# Patient Record
Sex: Male | Born: 1995 | Race: Black or African American | Hispanic: No | Marital: Single | Smoking: Current some day smoker
Health system: Southern US, Community
[De-identification: ages and names within clinical notes are randomized; demographics above are authoritative.]

## PROBLEM LIST (undated history)

## (undated) HISTORY — PX: OTHER SURGICAL HISTORY: SHX169

---
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
## 2017-02-04 ENCOUNTER — Encounter (HOSPITAL_COMMUNITY): Payer: Self-pay | Admitting: Emergency Medicine

## 2017-02-04 ENCOUNTER — Emergency Department (HOSPITAL_COMMUNITY): Payer: Medicaid Other

## 2017-02-04 DIAGNOSIS — Z91010 Allergy to peanuts: Secondary | ICD-10-CM | POA: Diagnosis not present

## 2017-02-04 DIAGNOSIS — J45909 Unspecified asthma, uncomplicated: Secondary | ICD-10-CM | POA: Diagnosis not present

## 2017-02-04 DIAGNOSIS — R079 Chest pain, unspecified: Secondary | ICD-10-CM | POA: Diagnosis present

## 2017-02-04 DIAGNOSIS — K2900 Acute gastritis without bleeding: Secondary | ICD-10-CM | POA: Insufficient documentation

## 2017-02-04 LAB — I-STAT TROPONIN, ED: Troponin i, poc: 0 ng/mL (ref 0.00–0.08)

## 2017-02-04 LAB — CBC
HEMATOCRIT: 44 % (ref 39.0–52.0)
Hemoglobin: 14.4 g/dL (ref 13.0–17.0)
MCH: 26.8 pg (ref 26.0–34.0)
MCHC: 32.7 g/dL (ref 30.0–36.0)
MCV: 81.8 fL (ref 78.0–100.0)
PLATELETS: 158 10*3/uL (ref 150–400)
RBC: 5.38 MIL/uL (ref 4.22–5.81)
RDW: 12.7 % (ref 11.5–15.5)
WBC: 7.8 10*3/uL (ref 4.0–10.5)

## 2017-02-04 LAB — BASIC METABOLIC PANEL
Anion gap: 12 (ref 5–15)
BUN: 17 mg/dL (ref 6–20)
CO2: 25 mmol/L (ref 22–32)
Calcium: 9.6 mg/dL (ref 8.9–10.3)
Chloride: 104 mmol/L (ref 101–111)
Creatinine, Ser: 1.23 mg/dL (ref 0.61–1.24)
Glucose, Bld: 104 mg/dL — ABNORMAL HIGH (ref 65–99)
Potassium: 4.2 mmol/L (ref 3.5–5.1)
SODIUM: 141 mmol/L (ref 135–145)

## 2017-02-04 NOTE — ED Triage Notes (Signed)
Pt to ED c/o intermittent central CP radiating to his L side x 3 days accompanied by intermittent SOB. Pt denies N/V/dizziness. Reports worsening pain after eating. Resp e/u, skin warm/dry.

## 2017-02-05 ENCOUNTER — Emergency Department (HOSPITAL_COMMUNITY)
Admission: EM | Admit: 2017-02-05 | Discharge: 2017-02-05 | Disposition: A | Discharge status: Home / Self Care | Payer: Medicaid Other | Attending: Emergency Medicine | Admitting: Emergency Medicine

## 2017-02-05 DIAGNOSIS — K2900 Acute gastritis without bleeding: Secondary | ICD-10-CM

## 2017-02-05 MED ORDER — SUCRALFATE 1 G PO TABS
1.0000 g | ORAL_TABLET | Freq: Three times a day (TID) | ORAL | 0 refills | Status: DC
Start: 2017-02-05 — End: 2019-07-04

## 2017-02-05 MED ORDER — OMEPRAZOLE 20 MG PO CPDR: 20.0000 mg | DELAYED_RELEASE_CAPSULE | Freq: Every day | ORAL | 0 refills | Status: DC

## 2017-02-05 MED ORDER — GI COCKTAIL ~~LOC~~
30.0000 mL | Freq: Once | ORAL | Status: AC
  Administered 2017-02-05: 30 mL via ORAL
  Filled 2017-02-05: qty 30

## 2017-02-05 NOTE — ED Notes (Signed)
Patient updated on delays and apologized for delays.

## 2017-02-05 NOTE — ED Provider Notes (Signed)
[facility]-EMERGENCY DEPT Provider Note   CSN: [id number] Arrival date & time: 02/04/17  2240    History   Chief Complaint Chief Complaint  Patient presents with  . Chest Pain    HPI Vincent Matthews is a 21 y.o. male.  21 year old male with a history of asthma presents to the emergency department for evaluation of chest pain. He states that he has been having sharp, nonradiating chest pain after eating for the past 3 days. This is intermittent and associated with mild shortness of breath. Patient took Alka-Seltzer for symptoms without significant improvement. He has not had any nausea, vomiting, dizziness, fever, leg swelling, or bowel changes. No family history of sudden cardiac death, DVT, or PE. Patient has no personal history of this. No history of abdominal surgeries. He denies any pain at present.      Past Medical History:  Diagnosis Date  . Asthma   . Dysphagia     Patient Active Problem List   Diagnosis Date Noted  . Swallowing difficulty     Past Surgical History:  Procedure Laterality Date  . ESOPHAGOGASTRODUODENOSCOPY  05/26/2012   Procedure: ESOPHAGOGASTRODUODENOSCOPY (EGD);  Surgeon: Jon Gills, MD;  Location: [facility] OR;  Service: Gastroenterology;  Laterality: N/A;  . nasal cauterization         Home Medications    Prior to Admission medications   Medication Sig Start Date End Date Taking? Authorizing Provider  acetaminophen (TYLENOL) 160 MG/5ML suspension Take 960 mg by mouth every 6 (six) hours as needed (for pain.).    Historical Provider, MD  albuterol (PROVENTIL HFA;VENTOLIN HFA) 108 (90 BASE) MCG/ACT inhaler Inhale 2 puffs into the lungs every 6 (six) hours as needed. For wheezing    Historical Provider, MD  amoxicillin (AMOXIL) 500 MG capsule Take 1 capsule (500 mg total) by mouth 3 (three) times daily. 01/26/15   Emilia Beck, PA-C  dextromethorphan (DELSYM) 30 MG/5ML liquid Take 5 mLs (30 mg total) by mouth as needed for cough. 01/26/15   Kaitlyn  Szekalski, PA-C  omeprazole (PRILOSEC) 20 MG capsule Take 1 capsule (20 mg total) by mouth daily. 02/05/17   Antony Madura, PA-C  sucralfate (CARAFATE) 1 g tablet Take 1 tablet (1 g total) by mouth 4 (four) times daily -  with meals and at bedtime. 02/05/17   Antony Madura, PA-C    Family History Family History  Problem Relation Age of Onset  . Asthma Father     Social History Social History  Substance Use Topics  . Smoking status: Never Smoker  . Smokeless tobacco: Never Used  . Alcohol use No     Allergies   Fish allergy; Peanut butter flavor; and Peanut-containing drug products   Review of Systems Review of Systems Ten systems reviewed and are negative for acute change, except as noted in the HPI.    Physical Exam Updated Vital Signs BP 110/73   Pulse 88   Temp 98.4 F (36.9 C) (Oral)   Resp 16   SpO2 100%   Physical Exam  Constitutional: He is oriented to person, place, and time. He appears well-developed and well-nourished. No distress.  Nontoxic and in NAD. Pleasant.  HENT:  Head: Normocephalic and atraumatic.  Eyes: Conjunctivae and EOM are normal. No scleral icterus.  Neck: Normal range of motion.  Cardiovascular: Normal rate, regular rhythm and intact distal pulses.   Pulmonary/Chest: Effort normal. No respiratory distress. He has no wheezes. He has no rales. He exhibits no tenderness.  Respirations even and  unlabored  Abdominal: Soft. He exhibits no distension and no mass. There is no tenderness. There is no guarding.  Soft, nontender abdomen.  Musculoskeletal: Normal range of motion.  Neurological: He is alert and oriented to person, place, and time. He exhibits normal muscle tone. Coordination normal.  Skin: Skin is warm and dry. No rash noted. He is not diaphoretic. No erythema. No pallor.  Psychiatric: He has a normal mood and affect. His behavior is normal.  Nursing note and vitals reviewed.    ED Treatments / Results  Labs (all labs ordered are  listed, but only abnormal results are displayed) Labs Reviewed  BASIC METABOLIC PANEL - Abnormal; Notable for the following:       Result Value   Glucose, Bld 104 (*)    All other components within normal limits  CBC  I-STAT TROPOININ, ED    EKG  EKG Interpretation  Date/Time:  Friday February 04 2017 23:09:26 EST Ventricular Rate:  89 PR Interval:  144 QRS Duration: 86 QT Interval:  322 QTC Calculation: 391 R Axis:   87 Text Interpretation:  Normal sinus rhythm Nonspecific T wave abnormality Abnormal ECG No old tracing to compare Confirmed by Griffiss Ec LLCGLICK  MD, DAVID ([id number]) on 02/05/2017 2:44:44 AM       Radiology Dg Chest 2 View  Result Date: 02/04/2017 CLINICAL DATA:  Chest pain EXAM: CHEST  2 VIEW COMPARISON:  Chest radiograph 01/25/2015 FINDINGS: The heart size and mediastinal contours are within normal limits. Both lungs are clear. The visualized skeletal structures are unremarkable. IMPRESSION: No active cardiopulmonary disease. Electronically Signed   By: Deatra RobinsonKevin  Herman M.D.   On: 02/04/2017 23:49    Procedures Procedures (including critical care time)  Medications Ordered in ED Medications  gi cocktail (Maalox,Lidocaine,Donnatal) (30 mLs Oral Given 02/05/17 0236)     Initial Impression / Assessment and Plan / ED Course  I have reviewed the triage vital signs and the nursing notes.  Pertinent labs & imaging results that were available during my care of the patient were reviewed by me and considered in my medical decision making (see chart for details).     21 year old male presents to the emergency department for evaluation of chest pain. Pain is sharp and has been present over the past 3 days, brought on by eating and drinking. Patient took Alka-Seltzer at home without relief. He has a heart score of 0. No family history of sudden cardiac death. Workup is reassuring today. No tachycardia, tachypnea, dyspnea, or hypoxia. Patient is PERC negative. Clinically, his symptoms are  most consistent with gastritis. Will start the patient on Prilosec and Carafate. Patient referred to his primary care doctor for follow-up. Return precautions discussed and provided. Patient discharged in stable condition with no unaddressed concerns.   Final Clinical Impressions(s) / ED Diagnoses   Final diagnoses:  Acute gastritis without hemorrhage, unspecified gastritis type    New Prescriptions New Prescriptions   OMEPRAZOLE (PRILOSEC) 20 MG CAPSULE    Take 1 capsule (20 mg total) by mouth daily.   SUCRALFATE (CARAFATE) 1 G TABLET    Take 1 tablet (1 g total) by mouth 4 (four) times daily -  with meals and at bedtime.     Antony MaduraKelly Aayla Marrocco, PA-C 02/05/17 0246    Dione Boozeavid Glick, MD 02/05/17 [phone]

## 2017-08-11 ENCOUNTER — Encounter (HOSPITAL_COMMUNITY)
Admission: EM | Disposition: A | Discharge status: Home / Self Care | Payer: Self-pay | Source: Home / Self Care | Attending: Emergency Medicine

## 2017-08-11 ENCOUNTER — Encounter (HOSPITAL_COMMUNITY): Payer: Self-pay | Admitting: Emergency Medicine

## 2017-08-11 ENCOUNTER — Ambulatory Visit (HOSPITAL_COMMUNITY)
Admission: EM | Admit: 2017-08-11 | Discharge: 2017-08-11 | Disposition: A | Discharge status: Home / Self Care | Payer: No Typology Code available for payment source | Attending: Emergency Medicine | Admitting: Emergency Medicine

## 2017-08-11 DIAGNOSIS — T18128A Food in esophagus causing other injury, initial encounter: Secondary | ICD-10-CM | POA: Insufficient documentation

## 2017-08-11 DIAGNOSIS — X58XXXA Exposure to other specified factors, initial encounter: Secondary | ICD-10-CM | POA: Diagnosis not present

## 2017-08-11 DIAGNOSIS — K200 Eosinophilic esophagitis: Secondary | ICD-10-CM | POA: Diagnosis not present

## 2017-08-11 DIAGNOSIS — J45909 Unspecified asthma, uncomplicated: Secondary | ICD-10-CM | POA: Insufficient documentation

## 2017-08-11 HISTORY — PX: ESOPHAGOGASTRODUODENOSCOPY: SHX5428

## 2017-08-11 LAB — BASIC METABOLIC PANEL
Anion gap: 8 (ref 5–15)
BUN: 13 mg/dL (ref 6–20)
CALCIUM: 9.2 mg/dL (ref 8.9–10.3)
CO2: 24 mmol/L (ref 22–32)
CREATININE: 1.04 mg/dL (ref 0.61–1.24)
Chloride: 107 mmol/L (ref 101–111)
GFR calc Af Amer: >60 mL/min (ref >60)
GFR calc non Af Amer: >60 mL/min (ref >60)
GLUCOSE: 123 mg/dL — AB (ref 65–99)
Potassium: 3.8 mmol/L (ref 3.5–5.1)
Sodium: 139 mmol/L (ref 135–145)

## 2017-08-11 LAB — CBC
HEMATOCRIT: 45.7 % (ref 39.0–52.0)
HEMOGLOBIN: 15.2 g/dL (ref 13.0–17.0)
MCH: 27.4 pg (ref 26.0–34.0)
MCHC: 33.3 g/dL (ref 30.0–36.0)
MCV: 82.5 fL (ref 78.0–100.0)
Platelets: 148 10*3/uL — ABNORMAL LOW (ref 150–400)
RBC: 5.54 MIL/uL (ref 4.22–5.81)
RDW: 12.5 % (ref 11.5–15.5)
WBC: 4.7 10*3/uL (ref 4.0–10.5)

## 2017-08-11 SURGERY — EGD (ESOPHAGOGASTRODUODENOSCOPY)
Anesthesia: Moderate Sedation

## 2017-08-11 MED ORDER — FENTANYL CITRATE (PF) 100 MCG/2ML IJ SOLN
INTRAMUSCULAR | Status: AC
  Filled 2017-08-11: qty 2

## 2017-08-11 MED ORDER — BUTAMBEN-TETRACAINE-BENZOCAINE 2-2-14 % EX AERO
INHALATION_SPRAY | CUTANEOUS | Status: DC | PRN
  Administered 2017-08-11: 2 via TOPICAL

## 2017-08-11 MED ORDER — GLUCAGON HCL RDNA (DIAGNOSTIC) 1 MG IJ SOLR
1.0000 mg | Freq: Once | INTRAMUSCULAR | Status: AC
  Administered 2017-08-11: 1 mg via INTRAVENOUS
  Filled 2017-08-11: qty 1

## 2017-08-11 MED ORDER — FENTANYL CITRATE (PF) 100 MCG/2ML IJ SOLN
INTRAMUSCULAR | Status: DC | PRN
  Administered 2017-08-11: 25 ug via INTRAVENOUS
  Administered 2017-08-11: 50 ug via INTRAVENOUS
  Administered 2017-08-11: 25 ug via INTRAVENOUS

## 2017-08-11 MED ORDER — MIDAZOLAM HCL 10 MG/2ML IJ SOLN
INTRAMUSCULAR | Status: DC | PRN
Start: 2017-08-11 — End: 2017-08-11
  Administered 2017-08-11 (×2): 2 mg via INTRAVENOUS
  Administered 2017-08-11: 1 mg via INTRAVENOUS
  Administered 2017-08-11: 2 mg via INTRAVENOUS

## 2017-08-11 MED ORDER — MIDAZOLAM HCL 5 MG/ML IJ SOLN
INTRAMUSCULAR | Status: AC
  Filled 2017-08-11: qty 2

## 2017-08-11 NOTE — ED Notes (Signed)
Attempted IV Glucagon with no success, pt continues to spit up drink. EDP is aware.

## 2017-08-11 NOTE — Interval H&P Note (Signed)
History and Physical Interval Note: Food bolus impaction, history of eosinophilic esophagitis  08/11/2017 8:25 AM  Krishay A Katrinka BlazingSmith  has presented today for EGD, with the diagnosis of food impaction  The various methods of treatment have been discussed with the patient and family. After consideration of risks, benefits and other options for treatment, the patient has consented to  Procedure(s): ESOPHAGOGASTRODUODENOSCOPY (EGD) (N/A) as a surgical intervention .  The patient's history has been reviewed, patient examined, no change in status, stable for surgery.  I have reviewed the patient's chart and labs.  Questions were answered to the patient's satisfaction.     Kerin SalenArya Amran Malter

## 2017-08-11 NOTE — Discharge Instructions (Signed)
Patient to stay on mechanical soft diet. Recommend take small bites and chew food properly. Benefit from use of PPI on a regular basis. Follow-up in the office as an outpatient. May benefit from use of swallowed steroids. May need EGD for dilatation in the future.   YOU HAD AN ENDOSCOPIC PROCEDURE TODAY: Refer to the procedure report and other information in the discharge instructions given to you for any specific questions about what was found during the examination. If this information does not answer your questions, please call [facility] office at [phone] to clarify.   YOU SHOULD EXPECT: Some feelings of bloating in the abdomen. Passage of more gas than usual. Walking can help get rid of the air that was put into your GI tract during the procedure and reduce the bloating. If you had a lower endoscopy (such as a colonoscopy or flexible sigmoidoscopy) you may notice spotting of blood in your stool or on the toilet paper. Some abdominal soreness may be present for a day or two, also.  DIET: Your first meal following the procedure should be a light meal and then it is ok to progress to your normal diet. A half-sandwich or bowl of soup is an example of a good first meal. Heavy or fried foods are harder to digest and may make you feel nauseous or bloated. Drink plenty of fluids but you should avoid alcoholic beverages for 24 hours. If you had a esophageal dilation, please see attached instructions for diet.   ACTIVITY: Your care partner should take you home directly after the procedure. You should plan to take it easy, moving slowly for the rest of the day. You can resume normal activity the day after the procedure however YOU SHOULD NOT DRIVE, use power tools, machinery or perform tasks that involve climbing or major physical exertion for 24 hours (because of the sedation medicines used during the test).   SYMPTOMS TO REPORT IMMEDIATELY: A gastroenterologist can be reached at any hour. Please call  [phone]  for any of the following symptoms:  Following lower endoscopy (colonoscopy, flexible sigmoidoscopy) Excessive amounts of blood in the stool  Significant tenderness, worsening of abdominal pains  Swelling of the abdomen that is new, acute  Fever of 100 or higher  Following upper endoscopy (EGD, EUS, ERCP, esophageal dilation) Vomiting of blood or coffee ground material  New, significant abdominal pain  New, significant chest pain or pain under the shoulder blades  Painful or persistently difficult swallowing  New shortness of breath  Black, tarry-looking or red, bloody stools  FOLLOW UP:  If any biopsies were taken you will be contacted by phone or by letter within the next 1-3 weeks. Call [phone]  if you have not heard about the biopsies in 3 weeks.  Please also call with any specific questions about appointments or follow up tests.

## 2017-08-11 NOTE — Op Note (Signed)
EGD revealed a food bolus impacted at 35 cm from insertion, it was removed in 1 piece with a [employer] basket.  The esophagus appeared to have a narrow caliber, longitudinal furrows and subtle ringed appearance compatible with known eosinophilic esophagitis diagnosed on esophageal biopsies in 2013.  Multiple superficial, linear esophageal tears were noted post-for food bolus retrieval. GE junction was located at 36 cm. The gastric cavity and duodenum appeared unremarkable.  Patient tolerated the procedure well under IV sedation.  Committed use of PPI regularly. Recommend follow up in the office as an outpatient. Will benefit from use of by swallowed steroids.   Vincent Matthews, M.D. [phone]

## 2017-08-11 NOTE — H&P (Signed)
Vincent Matthews, Vincent Matthews  21 year old African-American male presented to the ER after having difficulty swallowing. He was eating chicken at 2 AM this morning and immediately felt it stuck in the lower part of his throat. He tried washing it down with ginger ale, milk and water without any success.Whatever fluid he has been trying to drink, it seems to regurgitate back.  Patient had an EGD in 2013, and biopsies from esophagus at compatible with eosinophilic esophagitis. Has not been treated since and has not been on any medications for it.  Denies acid reflux or heartburn. Other past 5 years he has experienced  difficulty in swallowing especially with solids more than liquid.  This is not associated with weight loss. Patient denies black stools or bloody bowel movement.  Past medical history: Asthma Eosinophilic esophagitis  Past surgical history: None  Social history: Denies smoking, use of alcohol or use of recreational drugs  Family History: History of GI malignancy in the family  Review of systems: All other 12 systems were reviewed and there are no pertinent positives and negatives except for what is mentioned in history of present illness  Physical exam: Not in acute distress Overweight, no obvious pallor Chest clear  to auscultation bilaterally Heart: first and second has not some audible Abdomen: Soft, nondistended, nontender, normoactive bowel sounds Neuro: Focal neurological deficit able to move all 4 extremities Extremities: No deformity, no clubbing Skin: No rashes  Laboratory work: Unremarkable BMP unremarkable CBC  Assessment: History of eosinophilic esophagitis Food bolus impaction   Plan: EGD for food bolus disimpaction Possible esophageal biopsies Balloon dilatation if needed   Vincent SalenArya Janise Matthews, M.D. [phone]

## 2017-08-11 NOTE — Brief Op Note (Signed)
08/11/2017  8:44 AM  PATIENT:  Salvadore OxfordBrayhn A Shankland  21 y.o. male  PRE-OPERATIVE DIAGNOSIS:  food impaction  POST-OPERATIVE DIAGNOSIS:  food impaction, esophageal stricture  PROCEDURE:  Procedure(s): ESOPHAGOGASTRODUODENOSCOPY (EGD) (N/A)  SURGEON:  Surgeon(s) and Role:    Kerin Salen* Dniya Neuhaus, MD - Primary  PHYSICIAN ASSISTANT:   ASSISTANTS: none   ANESTHESIA:   IV sedation  EBL:  No intake/output data recorded.  BLOOD ADMINISTERED:none  DRAINS: none   LOCAL MEDICATIONS USED:  NONE  SPECIMEN:  No Specimen  DISPOSITION OF SPECIMEN:  N/A  COUNTS:  YES  TOURNIQUET:  * No tourniquets in log *  DICTATION: .Dragon Dictation  PLAN OF CARE: Discharge to home after PACU  PATIENT DISPOSITION:  PACU - hemodynamically stable.   Delay start of Pharmacological VTE agent (>24hrs) due to surgical blood loss or risk of bleeding: no

## 2017-08-11 NOTE — ED Triage Notes (Signed)
Pt reports eating chicken approx 30 min ago and felt it get stuck in his throat. Pt attempted to drink soda with no relief, kept spitting up the drink. Pt has muffled voice, no SOB. Hx of same. EDP aware.

## 2017-08-11 NOTE — ED Notes (Signed)
Per Dr. Blinda LeatherwoodPollina no orders at this time, work on getting pt room

## 2017-08-11 NOTE — ED Notes (Signed)
Patient is able to speak in complete sentences, no resp difficulty. States this has happened before however its usually pork chops.

## 2017-08-11 NOTE — ED Provider Notes (Signed)
[facility]-EMERGENCY DEPT Provider Note   CSN: [id number] Arrival date & time: 08/11/17  0243     History   Chief Complaint Chief Complaint  Patient presents with  . Food Stuck in Throat    HPI Vincent Matthews is a 21 y.o. male.  The history is provided by the patient. No language interpreter was used.  Swallowed Foreign Body  This is a new problem. The current episode started 6 to 12 hours ago. The problem occurs constantly. The problem has not changed since onset.Pertinent negatives include no chest pain and no abdominal pain. Nothing aggravates the symptoms. Nothing relieves the symptoms. He has tried nothing for the symptoms. The treatment provided no relief.  Pt ate chicken last night  Pt reports he has a esophageal deformity.  Pt has had a previous episode.  Past Medical History:  Diagnosis Date  . Asthma   . Dysphagia     Patient Active Problem List   Diagnosis Date Noted  . Swallowing difficulty     Past Surgical History:  Procedure Laterality Date  . ESOPHAGOGASTRODUODENOSCOPY  05/26/2012   Procedure: ESOPHAGOGASTRODUODENOSCOPY (EGD);  Surgeon: Jon Gills, MD;  Location: [facility] OR;  Service: Gastroenterology;  Laterality: N/A;  . nasal cauterization         Home Medications    Prior to Admission medications   Medication Sig Start Date End Date Taking? Authorizing Provider  acetaminophen (TYLENOL) 160 MG/5ML suspension Take 960 mg by mouth every 6 (six) hours as needed (for pain.).    [provider]  albuterol (PROVENTIL HFA;VENTOLIN HFA) 108 (90 BASE) MCG/ACT inhaler Inhale 2 puffs into the lungs every 6 (six) hours as needed. For wheezing    [provider]  amoxicillin (AMOXIL) 500 MG capsule Take 1 capsule (500 mg total) by mouth 3 (three) times daily. 01/26/15   Emilia Beck, PA-C  dextromethorphan (DELSYM) 30 MG/5ML liquid Take 5 mLs (30 mg total) by mouth as needed for cough. 01/26/15   Emilia Beck, PA-C  omeprazole (PRILOSEC) 20  MG capsule Take 1 capsule (20 mg total) by mouth daily. 02/05/17   Antony Madura, PA-C  sucralfate (CARAFATE) 1 g tablet Take 1 tablet (1 g total) by mouth 4 (four) times daily -  with meals and at bedtime. 02/05/17   Antony Madura, PA-C    Family History Family History  Problem Relation Age of Onset  . Asthma Father     Social History Social History  Substance Use Topics  . Smoking status: Never Smoker  . Smokeless tobacco: Never Used  . Alcohol use No     Allergies   Fish allergy; Peanut butter flavor; and Peanut-containing drug products   Review of Systems Review of Systems  Cardiovascular: Negative for chest pain.  Gastrointestinal: Negative for abdominal pain.  All other systems reviewed and are negative.    Physical Exam Updated Vital Signs BP 112/73   Pulse (!) 101   Temp 98.1 F (36.7 C) (Oral)   Resp 18   Ht  (1.753 m)   Wt 117.9 kg (260 lb)   SpO2 97%   BMI 38.40 kg/m   Physical Exam  Constitutional: He appears well-developed and well-nourished.  HENT:  Head: Normocephalic.  Right Ear: External ear normal.  Left Ear: External ear normal.  Mouth/Throat: Oropharynx is clear and moist.  Eyes: Pupils are equal, round, and reactive to light. Conjunctivae and EOM are normal.  Neck: Normal range of motion.  Cardiovascular: Normal rate.   Pulmonary/Chest:  Effort normal.  Musculoskeletal: Normal range of motion.  Neurological: He is alert.  Skin: Skin is warm.  Psychiatric: He has a normal mood and affect.  Nursing note and vitals reviewed.  Pt unable to tolerate po fluids,  Pt spitting up saliva and unable to sip water.  Pt failed glucogan  ED Treatments / Results  Labs (all labs ordered are listed, but only abnormal results are displayed) Labs Reviewed  CBC - Abnormal; Notable for the following:       Result Value   Platelets 148 (*)    All other components within normal limits  BASIC METABOLIC PANEL - Abnormal; Notable for the following:     Glucose, Bld 123 (*)    All other components within normal limits    EKG  EKG Interpretation None       Radiology No results found.  Procedures Procedures (including critical care time)  Medications Ordered in ED Medications  glucagon (human recombinant) (GLUCAGEN) injection 1 mg (1 mg Intravenous Given 08/11/17 0329)     Initial Impression / Assessment and Plan / ED Course  I have reviewed the triage vital signs and the nursing notes.  Pertinent labs & imaging results that were available during my care of the patient were reviewed by me and considered in my medical decision making (see chart for details).     I spoke to Dr. Ewing SchleinMagod who will see pt   Final Clinical Impressions(s) / ED Diagnoses   Final diagnoses:  Food impaction of esophagus, initial encounter    New Prescriptions New Prescriptions   No medications on file     Osie CheeksSofia, Sharayah Renfrow K, PA-C 08/11/17 0730    Elson AreasSofia, Clinton Dragone K, PA-C 08/11/17 1316    Gilda CreasePollina, Christopher J, MD 08/14/17 [phone]

## 2017-08-11 NOTE — Op Note (Signed)
[facility] Patient Name: Vincent Matthews Procedure Date : 08/11/2017 MRN: [id number] Attending MD: Kerin Salen , MD Date of Birth: September 06, 1996 CSN: [id number] Age: 21 Admit Type: Emergency Department Procedure:                Upper GI endoscopy Indications:              Foreign body/food bolus in the esophagus Providers:                Kerin Salen, MD, Norman Clay, RN, Zoila Shutter,                            Technician Referring MD:              Medicines:                Midazolam 7 mg IV, Fentanyl 100 micrograms IV Complications:            No immediate complications. Estimated Blood Loss:     Estimated blood loss: none. Procedure:                Pre-Anesthesia Assessment:                           - Prior to the procedure, a History and Physical                            was performed, and patient medications and                            allergies were reviewed. The patient's tolerance of                            previous anesthesia was also reviewed. The risks                            and benefits of the procedure and the sedation                            options and risks were discussed with the patient.                            All questions were answered, and informed consent                            was obtained. Prior Anticoagulants: The patient has                            taken no previous anticoagulant or antiplatelet                            agents. ASA Grade Assessment: II - A patient with                            mild systemic disease. After reviewing the risks  and benefits, the patient was deemed in                            satisfactory condition to undergo the procedure.                           After obtaining informed consent, the endoscope was                            passed under direct vision. Throughout the                            procedure, the patient's blood pressure, pulse, and        oxygen saturations were monitored continuously. The                            [id number]-[id number] ([id number]) scope was introduced through the                            mouth, and advanced to the second part of duodenum.                            The upper GI endoscopy was accomplished without                            difficulty. The patient tolerated the procedure                            well. Scope In: Scope Out: Findings:      Food was found in the lower third of the esophagus. Removal of food was       accomplished. A basket was used to retrieved the food bolus in one       piece, it was at 35 cm from insertion, above the GE junction located at       36 cm.      Mucosal changes including ringed esophagus, longitudinal furrows,       small-caliber esophagus, circumferential folds, longitudinal markings,       mucosal friability and tight circumferential folds were found in the       upper third of the esophagus, in the middle third of the esophagus and       in the lower third of the esophagus. Esophageal findings were graded       using the Eosinophilic Esophagitis Endoscopic Reference Score       (EoE-EREFS) as: Edema Grade 1 Present (decreased clarity or absence of       vascular markings), Rings Grade 1 Mild (subtle circumferential ridges       seen on esophageal distension), Exudates Grade 0 None (no white lesions       seen), Furrows Grade 1 Present (vertical lines with or without visible       depth) and Stricture present.      Several superficial small, linear tears were appreciated after the food       bolus was removed in upper,mid and distal esophagus.      The entire examined stomach was normal.      The cardia  and gastric fundus were normal on retroflexion.      The examined duodenum was normal. Impression:               - Food in the lower third of the esophagus. Removal                            was successful.                           - Esophageal mucosal changes  secondary to                            eosinophilic esophagitis.                           - Normal stomach.                           - Normal examined duodenum. Moderate Sedation:      Moderate (conscious) sedation was administered by the endoscopy nurse       and supervised by the endoscopist. The following parameters were       monitored: oxygen saturation, heart rate, blood pressure, respiratory       rate, EKG, adequacy of pulmonary ventilation, and response to care. Recommendation:           - Mechanical soft diet.                           - Use Protonix (pantoprazole) 40 mg PO daily.                           - Patient will benefit from swallowed steroid use                            and repeat EGD for dilatation if needed.                           - Return to GI office at appointment to be                            scheduled.                           - Post procedure medication instructions were                            provided to the patient.                           - Patient has a contact number available for                            emergencies. The signs and symptoms of potential                            delayed complications were discussed with the  patient. Return to normal activities tomorrow.                            Written discharge instructions were provided to the                            patient. Procedure Code(s):        --- Professional ---                           [phone], Esophagogastroduodenoscopy, flexible,                            transoral; with removal of foreign body(s) Diagnosis Code(s):        --- Professional ---                           U04.540J, Food in esophagus causing other injury,                            initial encounter                           K20.0, Eosinophilic esophagitis                           T18.108A, Unspecified foreign body in esophagus                            causing other  injury, initial encounter CPT copyright 2016 [employer]. All rights reserved. The codes documented in this report are preliminary and upon coder review may  be revised to meet current compliance requirements. Kerin Salen, MD 08/11/2017 8:44:22 AM This report has been signed electronically. Number of Addenda: 0

## 2018-02-16 ENCOUNTER — Other Ambulatory Visit: Payer: Self-pay

## 2018-02-16 ENCOUNTER — Emergency Department (HOSPITAL_COMMUNITY)
Admission: EM | Admit: 2018-02-16 | Discharge: 2018-02-16 | Discharge status: Against Medical Advice | Payer: No Typology Code available for payment source

## 2018-02-16 NOTE — ED Triage Notes (Signed)
Pt states he is leaivng

## 2018-08-29 ENCOUNTER — Emergency Department (HOSPITAL_COMMUNITY): Payer: 59

## 2018-08-29 ENCOUNTER — Other Ambulatory Visit: Payer: Self-pay

## 2018-08-29 ENCOUNTER — Emergency Department (HOSPITAL_COMMUNITY)
Admission: EM | Admit: 2018-08-29 | Discharge: 2018-08-29 | Disposition: A | Discharge status: Home / Self Care | Payer: 59 | Attending: Emergency Medicine | Admitting: Emergency Medicine

## 2018-08-29 DIAGNOSIS — R0789 Other chest pain: Secondary | ICD-10-CM | POA: Diagnosis not present

## 2018-08-29 DIAGNOSIS — R0602 Shortness of breath: Secondary | ICD-10-CM | POA: Diagnosis not present

## 2018-08-29 DIAGNOSIS — J45909 Unspecified asthma, uncomplicated: Secondary | ICD-10-CM | POA: Diagnosis not present

## 2018-08-29 DIAGNOSIS — R079 Chest pain, unspecified: Secondary | ICD-10-CM | POA: Diagnosis present

## 2018-08-29 LAB — CBC
HEMATOCRIT: 47.9 % (ref 39.0–52.0)
HEMOGLOBIN: 15.7 g/dL (ref 13.0–17.0)
MCH: 27.7 pg (ref 26.0–34.0)
MCHC: 32.8 g/dL (ref 30.0–36.0)
MCV: 84.6 fL (ref 78.0–100.0)
Platelets: 164 10*3/uL (ref 150–400)
RBC: 5.66 MIL/uL (ref 4.22–5.81)
RDW: 12 % (ref 11.5–15.5)
WBC: 4.5 10*3/uL (ref 4.0–10.5)

## 2018-08-29 LAB — I-STAT TROPONIN, ED
Troponin i, poc: 0 ng/mL (ref 0.00–0.08)
Troponin i, poc: 0 ng/mL (ref 0.00–0.08)

## 2018-08-29 LAB — BASIC METABOLIC PANEL
ANION GAP: 10 (ref 5–15)
BUN: 13 mg/dL (ref 6–20)
CHLORIDE: 106 mmol/L (ref 98–111)
CO2: 25 mmol/L (ref 22–32)
Calcium: 9.2 mg/dL (ref 8.9–10.3)
Creatinine, Ser: 1.22 mg/dL (ref 0.61–1.24)
GFR calc non Af Amer: >60 mL/min (ref >60)
Glucose, Bld: 118 mg/dL — ABNORMAL HIGH (ref 70–99)
POTASSIUM: 3.9 mmol/L (ref 3.5–5.1)
Sodium: 141 mmol/L (ref 135–145)

## 2018-08-29 MED ORDER — ALBUTEROL SULFATE HFA 108 (90 BASE) MCG/ACT IN AERS
1.0000 | INHALATION_SPRAY | Freq: Once | RESPIRATORY_TRACT | Status: AC
  Administered 2018-08-29: 1 via RESPIRATORY_TRACT
  Filled 2018-08-29: qty 6.7

## 2018-08-29 MED ORDER — PREDNISONE 20 MG PO TABS
60.0000 mg | ORAL_TABLET | Freq: Once | ORAL | Status: AC
  Administered 2018-08-29: 60 mg via ORAL
  Filled 2018-08-29: qty 3

## 2018-08-29 MED ORDER — PREDNISONE 10 MG PO TABS: 40.0000 mg | ORAL_TABLET | Freq: Every day | ORAL | 0 refills | Status: AC

## 2018-08-29 MED ORDER — IPRATROPIUM-ALBUTEROL 0.5-2.5 (3) MG/3ML IN SOLN
3.0000 mL | Freq: Once | RESPIRATORY_TRACT | Status: AC
  Administered 2018-08-29: 3 mL via RESPIRATORY_TRACT
  Filled 2018-08-29: qty 3

## 2018-08-29 MED ORDER — ALBUTEROL SULFATE (2.5 MG/3ML) 0.083% IN NEBU
5.0000 mg | INHALATION_SOLUTION | Freq: Once | RESPIRATORY_TRACT | Status: AC
  Administered 2018-08-29: 5 mg via RESPIRATORY_TRACT

## 2018-08-29 MED ORDER — KETOROLAC TROMETHAMINE 30 MG/ML IJ SOLN
30.0000 mg | Freq: Once | INTRAMUSCULAR | Status: AC
  Administered 2018-08-29: 30 mg via INTRAMUSCULAR
  Filled 2018-08-29: qty 1

## 2018-08-29 NOTE — ED Provider Notes (Signed)
[facility]   CSN: [id number] Arrival date & time: 08/29/18  0102     History   Chief Complaint Chief Complaint  Patient presents with  . Chest Pain  . Shortness of Breath    HPI Vincent Matthews is a 22 y.o. male with history of asthma and dysphasia presents for evaluation of acute onset, progressively improving left-sided chest pain beginning at around midnight last night.  He states that he was driving and arguing with his significant other when he suddenly began to feel tightness to the left side of the chest as well as numbness of the left upper extremity.  He also notes shortness of breath.  Denies any associated nausea, vomiting, lightheadedness, or diaphoresis.  He states that numbness lasted for 1 minute and then resolved and has not returned.  Shortness of breath and chest pain have improved but are persistent.  Worse leaning forward, improved laying back.  No medications prior to arrival.  He had wheezing on initial presentation per triage note which improved after breathing treatment.  Denies recent travel or surgeries, no hemoptysis, no prior history of DVT or PE, and he is not on testosterone hormone placement therapy.  He is a non-smoker, drinks alcohol socially, denies recreational drug use.  The history is provided by the patient.    Past Medical History:  Diagnosis Date  . Asthma   . Dysphagia     Patient Active Problem List   Diagnosis Date Noted  . Swallowing difficulty     Past Surgical History:  Procedure Laterality Date  . ESOPHAGOGASTRODUODENOSCOPY  05/26/2012   Procedure: ESOPHAGOGASTRODUODENOSCOPY (EGD);  Surgeon: Jon GillsJoseph H Clark, MD;  [facility];  Service: Gastroenterology;  Laterality: N/A;  . ESOPHAGOGASTRODUODENOSCOPY N/A 08/11/2017   Procedure: ESOPHAGOGASTRODUODENOSCOPY (EGD);  Surgeon: Kerin SalenKarki, Arya, MD;  [facility];  Service: Gastroenterology;  Laterality: N/A;  . nasal  cauterization          Home Medications    Prior to Admission medications   Medication Sig Start Date End Date Taking? Authorizing Provider  dextromethorphan (DELSYM) 30 MG/5ML liquid Take 5 mLs (30 mg total) by mouth as needed for cough. Patient not taking: Reported on 08/29/2018 01/26/15   Emilia BeckSzekalski, Kaitlyn, PA-C  omeprazole (PRILOSEC) 20 MG capsule Take 1 capsule (20 mg total) by mouth daily. Patient not taking: Reported on 08/29/2018 02/05/17   Antony MaduraHumes, Kelly, PA-C  predniSONE (DELTASONE) 10 MG tablet Take 4 tablets (40 mg total) by mouth daily for 5 days. 08/29/18 09/03/18  Michela PitcherFawze, Diane Mochizuki A, PA-C  sucralfate (CARAFATE) 1 g tablet Take 1 tablet (1 g total) by mouth 4 (four) times daily -  with meals and at bedtime. Patient not taking: Reported on 08/29/2018 02/05/17   Antony MaduraHumes, Kelly, PA-C    Family History Family History  Problem Relation Age of Onset  . Asthma Father     Social History Social History   Tobacco Use  . Smoking status: Never Smoker  . Smokeless tobacco: Never Used  Substance Use Topics  . Alcohol use: No  . Drug use: No     Allergies   Fish allergy; Peanut butter flavor; and Peanut-containing drug products   Review of Systems Review of Systems  Constitutional: Negative for chills, diaphoresis and fever.  Respiratory: Positive for shortness of breath.   Cardiovascular: Positive for chest pain. Negative for leg swelling.  Gastrointestinal: Negative for abdominal pain, nausea and vomiting.  All other systems reviewed and are negative.  Physical Exam Updated Vital Signs BP 110/64   Pulse 81   Temp 98.4 F (36.9 C) (Oral)   Resp 18   SpO2 99%   Physical Exam  Constitutional: He appears well-developed and well-nourished. No distress.  HENT:  Head: Normocephalic and atraumatic.  Eyes: Conjunctivae are normal. Right eye exhibits no discharge. Left eye exhibits no discharge.  Neck: No JVD present. No tracheal deviation present.  Cardiovascular: Normal  rate and regular rhythm.  Pulses:      Carotid pulses are 2+ on the right side, and 2+ on the left side.      Radial pulses are 2+ on the right side, and 2+ on the left side.       Dorsalis pedis pulses are 2+ on the right side, and 2+ on the left side.       Posterior tibial pulses are 2+ on the right side, and 2+ on the left side.  2+ radial and DP/PT pulses bilaterally, Homans sign absent bilaterally, no lower extremity edema, no palpable cords, compartments are soft   Pulmonary/Chest: Effort normal.  Globally diminished breath sounds, equal rise and fall of chest, no increased work of breathing.  Speaking in full sentences without difficulty.  No tenderness palpation of the chest wall  Abdominal: Soft. Bowel sounds are normal. He exhibits no distension. There is no tenderness.  Musculoskeletal: He exhibits no edema.       Right lower leg: Normal. He exhibits no tenderness and no edema.       Left lower leg: Normal. He exhibits no tenderness and no edema.  Neurological: He is alert.  Skin: Skin is warm and dry. No erythema.  Psychiatric: He has a normal mood and affect. His behavior is normal.  Nursing note and vitals reviewed.    ED Treatments / Results  Labs (all labs ordered are listed, but only abnormal results are displayed) Labs Reviewed  BASIC METABOLIC PANEL - Abnormal; Notable for the following components:      Result Value   Glucose, Bld 118 (*)    All other components within normal limits  CBC  I-STAT TROPONIN, ED  I-STAT TROPONIN, ED    EKG EKG Interpretation  Date/Time:  Tuesday August 29 2018 01:15:22 EDT Ventricular Rate:  101 PR Interval:  130 QRS Duration: 84 QT Interval:  310 QTC Calculation: 401 R Axis:   93 Text Interpretation:  Sinus tachycardia Rightward axis Nonspecific ST and T wave abnormality Abnormal ECG When compared with ECG of 02/16/2018, No significant change was found Confirmed by Dione Booze ([id number]) on 08/29/2018 1:15:27 AM Also  confirmed by Dione Booze ([id number]), editor Barbette Hair [phone])  on 08/29/2018 6:52:27 AM   Radiology Dg Chest 2 View  Result Date: 08/29/2018 CLINICAL DATA:  Chest pain EXAM: CHEST - 2 VIEW COMPARISON:  02/04/2017 chest radiograph. FINDINGS: Stable cardiomediastinal silhouette with normal heart size. No pneumothorax. No pleural effusion. Lungs appear clear, with no acute consolidative airspace disease and no pulmonary edema. IMPRESSION: No active cardiopulmonary disease. Electronically Signed   By: Delbert Phenix M.D.   On: 08/29/2018 02:35    Procedures Procedures (including critical care time)  Medications Ordered in ED Medications  albuterol (PROVENTIL HFA;VENTOLIN HFA) 108 (90 Base) MCG/ACT inhaler 1 puff (has no administration in time range)  albuterol (PROVENTIL) (2.5 MG/3ML) 0.083% nebulizer solution 5 mg (5 mg Nebulization Given 08/29/18 0127)  ipratropium-albuterol (DUONEB) 0.5-2.5 (3) MG/3ML nebulizer solution 3 mL (3 mLs Nebulization Given 08/29/18 0640)  predniSONE (DELTASONE) tablet  60 mg (60 mg Oral Given 08/29/18 0640)  ketorolac (TORADOL) 30 MG/ML injection 30 mg (30 mg Intramuscular Given 08/29/18 0640)     Initial Impression / Assessment and Plan / ED Course  I have reviewed the triage vital signs and the nursing notes.  Pertinent labs & imaging results that were available during my care of the patient were reviewed by me and considered in my medical decision making (see chart for details).     Patient presents with acute onset, progressively worsening shortness of breath and left-sided chest pains which he described as a tightness.  He is afebrile, vital signs are stable.  He is nontoxic in appearance.  No increased work of breathing noted on exam but he does have diminished breath sounds and apparently had wheezing on presentation which improved after initial treatment.  Chest x-ray shows no acute cardiopulmonary abnormalities, no evidence of edema or consolidation.  EKG  shows no ischemic changes and is unchanged from last tracing.  Remainder of lab work reviewed by me is essentially unremarkable with no leukocytosis, no metabolic derangements.  Serial troponins are negative. HEART score of 1, patient is low risk for cardiac disease.  Doubt ACS/MI.  No evidence of pericarditis on EKG.  He is PERC negative, doubt PE.  No evidence of myocarditis, dissection, cardiac tamponade.  He received IM Toradol, p.o. prednisone and a second breathing treatment and on reevaluation lung sounds have improved and he states he is feeling better.  We will refill his rescue inhaler.  Will discharge with prednisone burst.  No further emergent work-up required at this time.  Recommend follow-up with PCP if symptoms persist.  Discussed strict ED return precautions.  Patient and patient's significant other verbalized understanding of and agreement with plan and patient is stable for discharge home at this time.  Final Clinical Impressions(s) / ED Diagnoses   Final diagnoses:  Atypical chest pain  SOB (shortness of breath)    ED Discharge Orders         Ordered    predniSONE (DELTASONE) 10 MG tablet  Daily     08/29/18 0724           Jeanie Sewer, PA-C 08/29/18 Felecia Jan, MD 08/29/18 [phone]

## 2018-08-29 NOTE — ED Notes (Signed)
Lungs clear after treatment

## 2018-08-29 NOTE — Discharge Instructions (Addendum)
Take prednisone as prescribed beginning tomorrow.  You received the first dose in the emergency department today. Alternate 600 mg of ibuprofen and [phone] mg of Tylenol every 3 hours as needed for pain. Do not exceed 4000 mg of Tylenol daily.  Take ibuprofen with food to avoid upset stomach issues.  Use the albuterol inhaler as needed for shortness of breath as prescribed.  Follow-up with primary care if symptoms persist.  Return to the emergency department if any concerning signs or symptoms develop such as persistent shortness of breath or chest pain despite use of medications, coughing up blood, high fevers, or lightheadedness.

## 2018-08-29 NOTE — ED Triage Notes (Signed)
Pt here for chest pain and said that the left side of his body hurts to breathe.  Hx of asthma.  A&ox4, wheezing in the bases.

## 2019-07-04 ENCOUNTER — Emergency Department (HOSPITAL_COMMUNITY): Payer: 59

## 2019-07-04 ENCOUNTER — Emergency Department (HOSPITAL_COMMUNITY)
Admission: EM | Admit: 2019-07-04 | Discharge: 2019-07-04 | Disposition: A | Discharge status: Home / Self Care | Payer: 59 | Attending: Emergency Medicine | Admitting: Emergency Medicine

## 2019-07-04 ENCOUNTER — Encounter (HOSPITAL_COMMUNITY): Payer: Self-pay | Admitting: Emergency Medicine

## 2019-07-04 ENCOUNTER — Other Ambulatory Visit: Payer: Self-pay

## 2019-07-04 DIAGNOSIS — F17210 Nicotine dependence, cigarettes, uncomplicated: Secondary | ICD-10-CM | POA: Insufficient documentation

## 2019-07-04 DIAGNOSIS — R0789 Other chest pain: Secondary | ICD-10-CM

## 2019-07-04 DIAGNOSIS — Z91010 Allergy to peanuts: Secondary | ICD-10-CM | POA: Insufficient documentation

## 2019-07-04 DIAGNOSIS — J45909 Unspecified asthma, uncomplicated: Secondary | ICD-10-CM | POA: Diagnosis not present

## 2019-07-04 LAB — CBC
HCT: 46.4 % (ref 39.0–52.0)
Hemoglobin: 15 g/dL (ref 13.0–17.0)
MCH: 27.7 pg (ref 26.0–34.0)
MCHC: 32.3 g/dL (ref 30.0–36.0)
MCV: 85.8 fL (ref 80.0–100.0)
Platelets: 150 10*3/uL (ref 150–400)
RBC: 5.41 MIL/uL (ref 4.22–5.81)
RDW: 12.5 % (ref 11.5–15.5)
WBC: 4.5 10*3/uL (ref 4.0–10.5)
nRBC: 0 % (ref 0.0–0.2)

## 2019-07-04 LAB — BASIC METABOLIC PANEL WITH GFR
Anion gap: 8 (ref 5–15)
BUN: 17 mg/dL (ref 6–20)
CO2: 23 mmol/L (ref 22–32)
Calcium: 8.7 mg/dL — ABNORMAL LOW (ref 8.9–10.3)
Chloride: 106 mmol/L (ref 98–111)
Creatinine, Ser: 0.97 mg/dL (ref 0.61–1.24)
GFR calc Af Amer: >60 mL/min
GFR calc non Af Amer: >60 mL/min
Glucose, Bld: 96 mg/dL (ref 70–99)
Potassium: 3.8 mmol/L (ref 3.5–5.1)
Sodium: 137 mmol/L (ref 135–145)

## 2019-07-04 LAB — TROPONIN I (HIGH SENSITIVITY): Troponin I (High Sensitivity): <2 ng/L (ref <18)

## 2019-07-04 MED ORDER — OMEPRAZOLE 20 MG PO CPDR: 20.0000 mg | DELAYED_RELEASE_CAPSULE | Freq: Every day | ORAL | 0 refills | Status: DC

## 2019-07-04 NOTE — ED Triage Notes (Signed)
pt c/o chest pains for 3 days on left side. Reports yesterday had fever of 99.0 but didn't take anything for it.
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
## 2019-07-04 NOTE — Discharge Instructions (Signed)
Please stop smoking Try Omeprazole 20mg  for acid reflux and see if this helps your symptoms Return if worsening

## 2019-08-17 IMAGING — DX DG CHEST 2V
2 series · 2 of 2 positions shown · non-contrast
Comparison: 02/04/2017 chest radiograph.

CLINICAL DATA: Chest pain

EXAM:
CHEST - 2 VIEW

[chest pa]
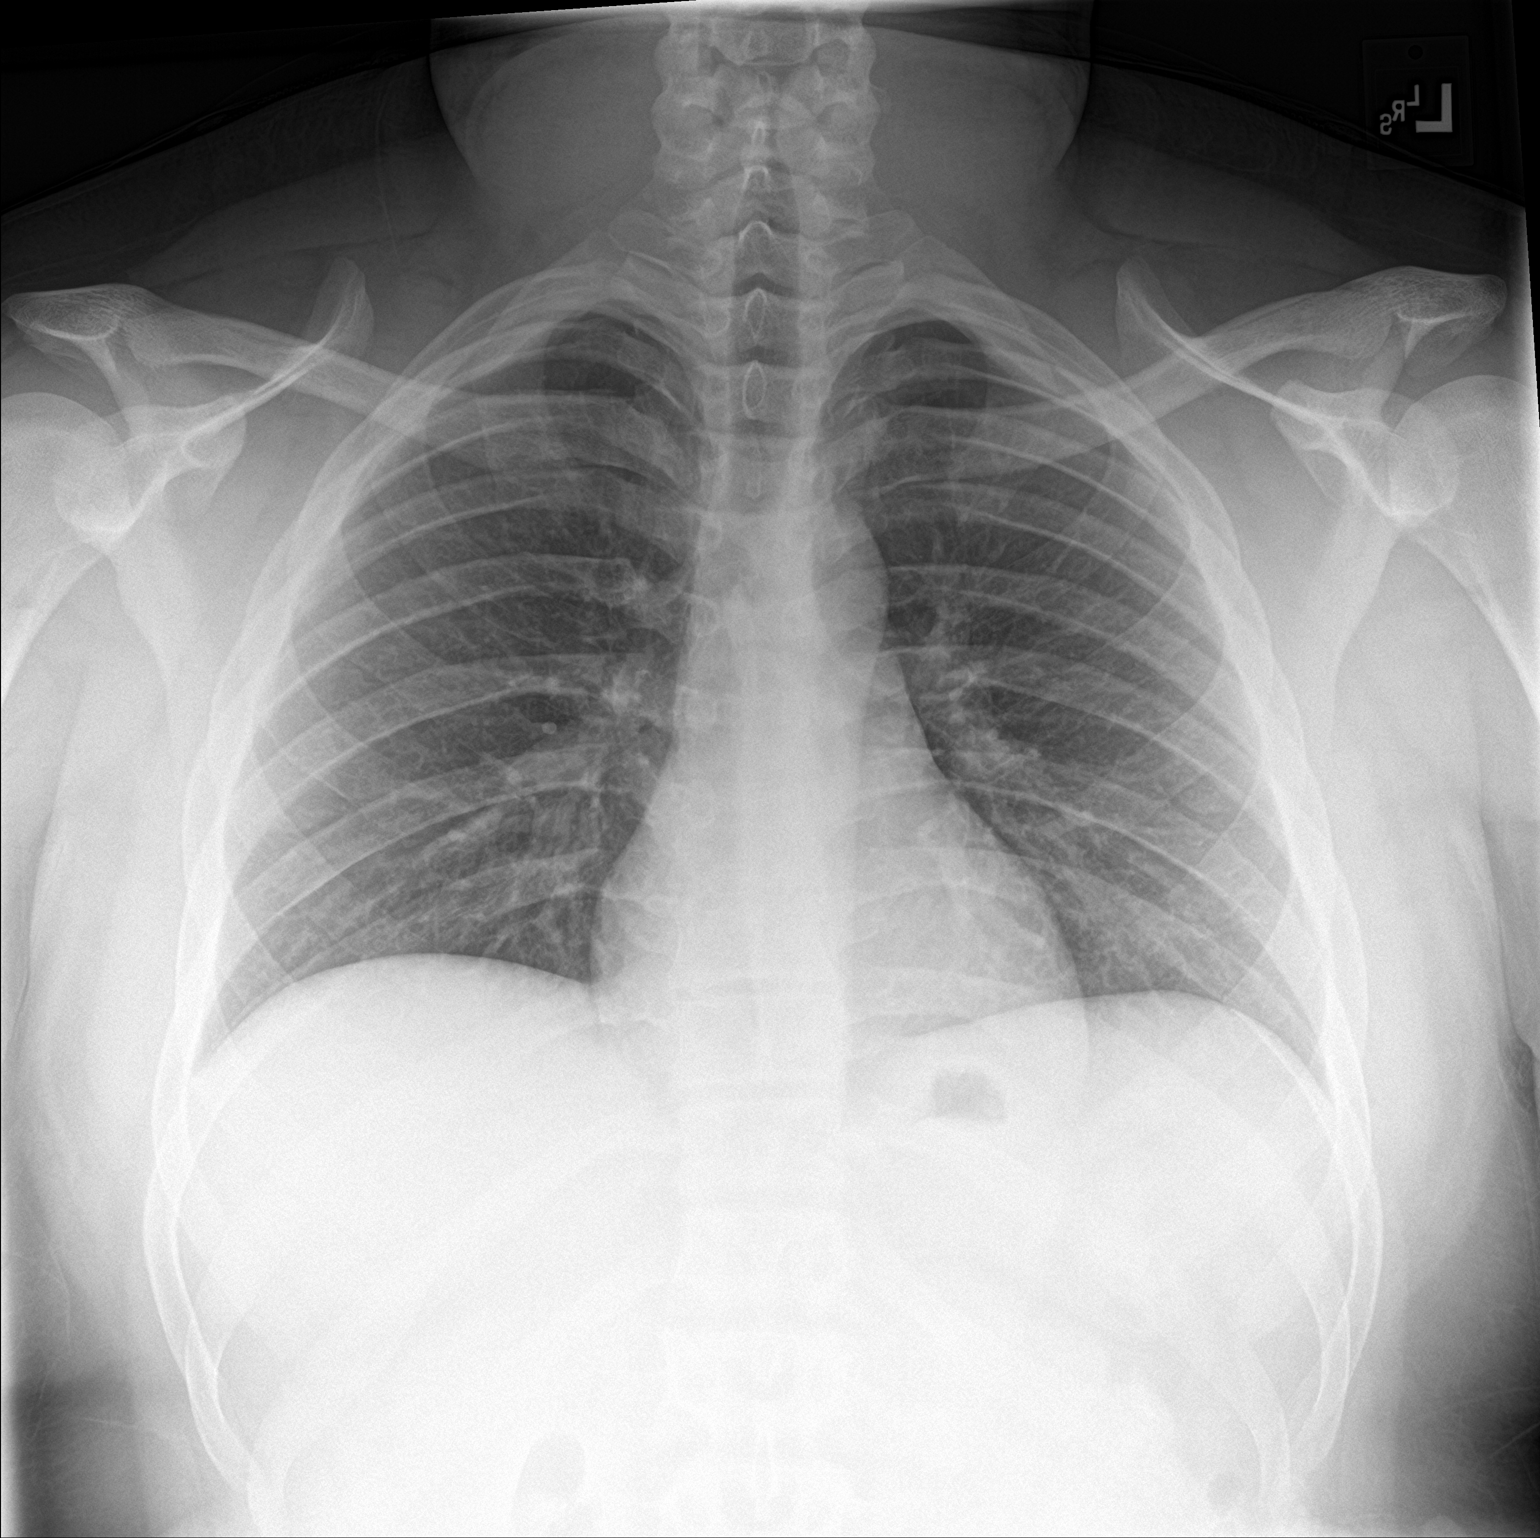

[chest lat]
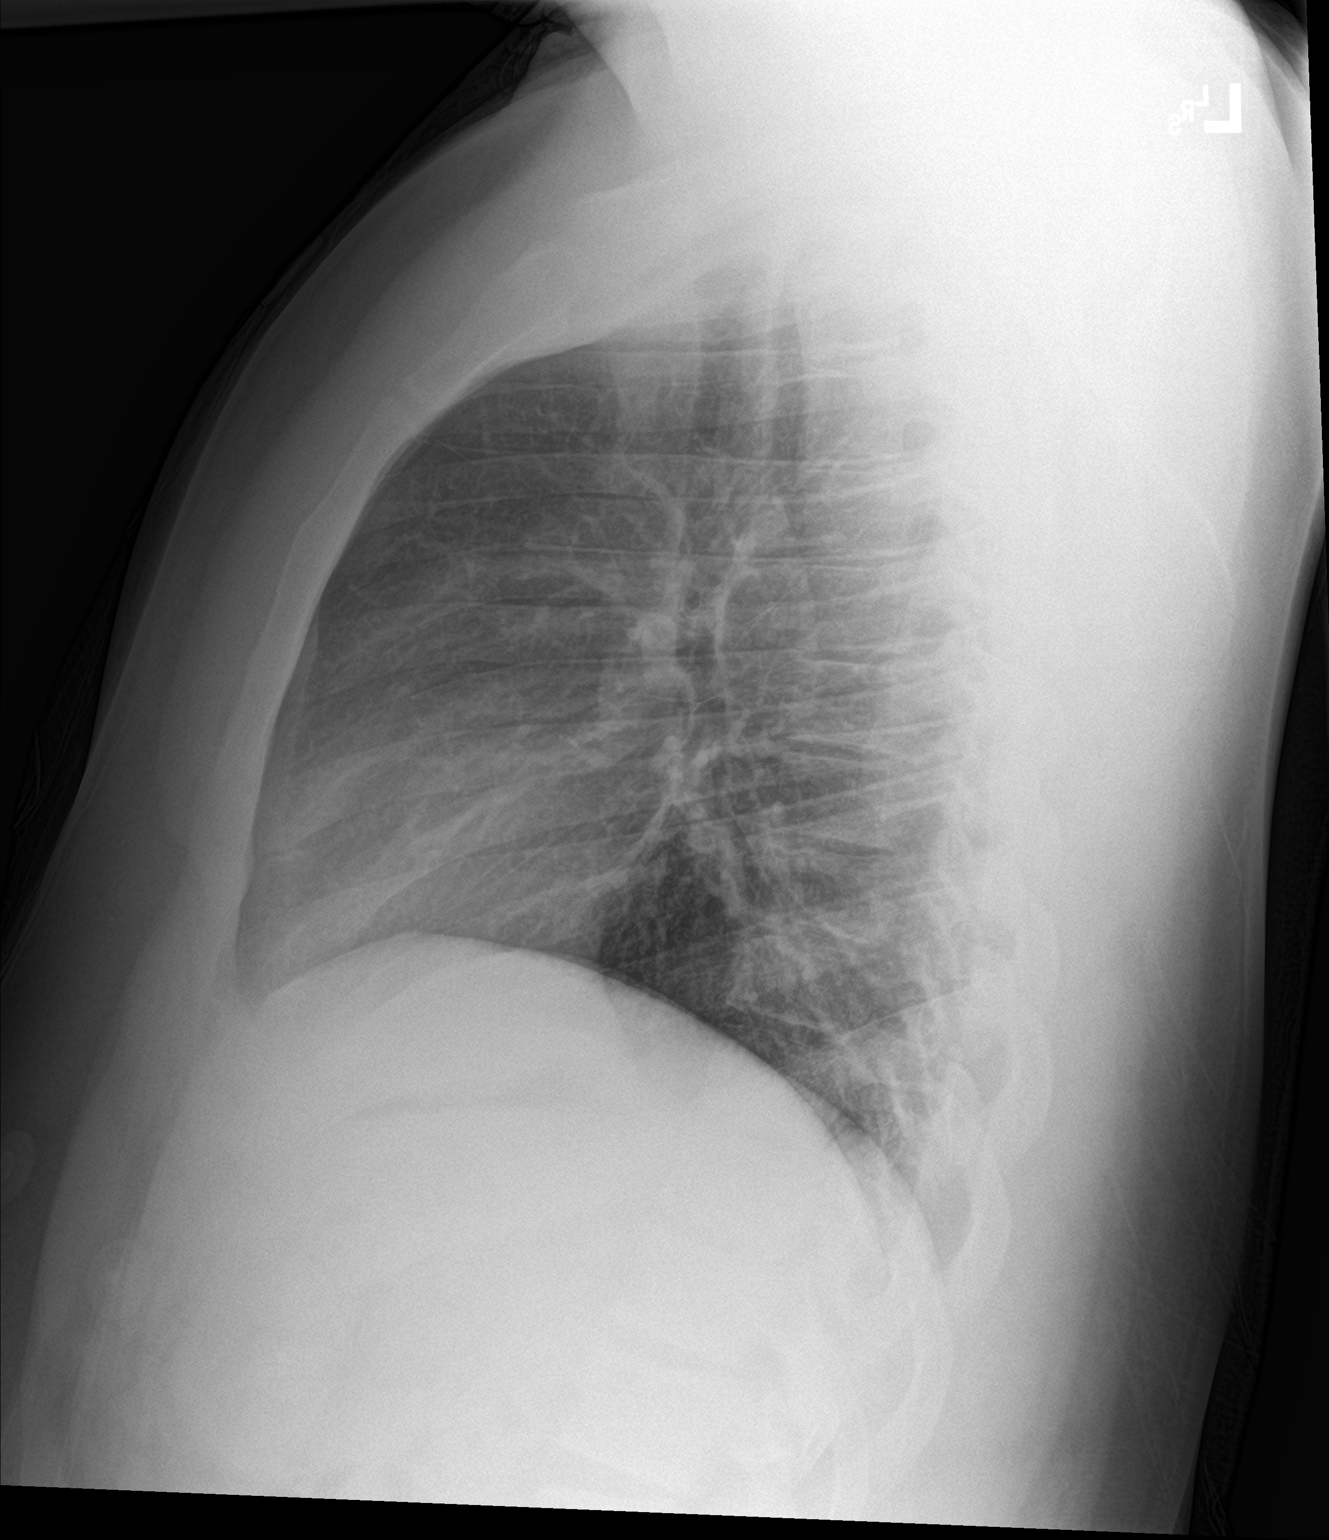

[2 of 2 positions shown; findings below may reference images not displayed]

FINDINGS: Stable cardiomediastinal silhouette with normal heart size. No
pneumothorax. No pleural effusion. Lungs appear clear, with no acute
consolidative airspace disease and no pulmonary edema.
IMPRESSION: No active cardiopulmonary disease.

## 2019-09-26 ENCOUNTER — Ambulatory Visit: Payer: 59 | Admitting: Family Medicine

## 2020-06-25 ENCOUNTER — Other Ambulatory Visit: Payer: Self-pay

## 2020-06-25 ENCOUNTER — Ambulatory Visit (HOSPITAL_COMMUNITY)
Admission: EM | Admit: 2020-06-25 | Discharge: 2020-06-25 | Disposition: A | Discharge status: Home / Self Care | Payer: 59 | Attending: Family Medicine | Admitting: Family Medicine

## 2020-06-25 ENCOUNTER — Encounter (HOSPITAL_COMMUNITY): Payer: Self-pay | Admitting: Emergency Medicine

## 2020-06-25 DIAGNOSIS — J020 Streptococcal pharyngitis: Secondary | ICD-10-CM | POA: Diagnosis present

## 2020-06-25 DIAGNOSIS — J45909 Unspecified asthma, uncomplicated: Secondary | ICD-10-CM | POA: Diagnosis not present

## 2020-06-25 DIAGNOSIS — Z20822 Contact with and (suspected) exposure to covid-19: Secondary | ICD-10-CM | POA: Diagnosis not present

## 2020-06-25 DIAGNOSIS — J029 Acute pharyngitis, unspecified: Secondary | ICD-10-CM | POA: Diagnosis not present

## 2020-06-25 LAB — POCT RAPID STREP A: Streptococcus, Group A Screen (Direct): POSITIVE — AB

## 2020-06-25 MED ORDER — AMOXICILLIN 875 MG PO TABS: 875.0000 mg | ORAL_TABLET | Freq: Two times a day (BID) | ORAL | 0 refills | Status: AC

## 2020-06-25 NOTE — ED Triage Notes (Signed)
Pt here for sore throat x 3 days  

## 2020-06-25 NOTE — ED Provider Notes (Signed)
[facility]   [id number] 06/25/20 Arrival Time: 1807  ASSESSMENT & PLAN:  1. Sore throat   2. Strep throat     No signs of peritonsillar abscess. Discussed.  Meds ordered this encounter  Medications  . amoxicillin (AMOXIL) 875 MG tablet    Sig: Take 1 tablet (875 mg total) by mouth 2 (two) times daily for 10 days.    Dispense:  20 tablet    Refill:  0    Results for orders placed or performed during the hospital encounter of 06/25/20  POCT rapid strep A [facility])  Result Value Ref Range   Streptococcus, Group A Screen (Direct) POSITIVE (A) NEGATIVE   Labs Reviewed  SARS CORONAVIRUS 2 (TAT 6-24 HRS)    OTC analgesics and throat care as needed  Instructed to finish full 10 day course of antibiotics. Will follow up if not showing significant improvement over the next 24-48 hours.  Reviewed expectations re: course of current medical issues. Questions answered. Outlined signs and symptoms indicating need for more acute intervention. Patient verbalized understanding. After Visit Summary given.   SUBJECTIVE:  Vincent Matthews is a 24 y.o. male who reports a sore throat. L-sided. Pain with swallowing. Onset abrupt beginning 2-3 d ago. Symptoms have gradually worsened since beginning; without voice changes. No respiratory symptoms. Tolerates PO intake but reports discomfort with swallowing. No specific alleviating factors. Fever: ques subjective. No associated nausea, vomiting, or abdominal pain. Known sick contacts: none. Recent travel: none. OTC treatment: none reported.    OBJECTIVE:  Vitals:   06/25/20 1854  BP: 139/75  Pulse: 92  Resp: 18  Temp: 99.4 F (37.4 C)  TempSrc: Oral  SpO2: 100%     General appearance: alert; no distress HEENT: throat with moderate erythema; uvula is midline; no exudates Neck: supple with FROM; moderate L-sided cervical LAD with TTP Lungs: speaks full sentences without difficulty; unlabored Abd: soft Skin: reveals  no rash; warm and dry Psychological: alert and cooperative; normal mood and affect  Allergies  Allergen Reactions  . Fish Allergy Anaphylaxis  . Peanut Butter Flavor Anaphylaxis  . Peanut-Containing Drug Products Anaphylaxis    Past Medical History:  Diagnosis Date  . Asthma   . Dysphagia    Social History   Socioeconomic History  . Marital status: Single    Spouse name: Not on file  . Number of children: Not on file  . Years of education: Not on file  . Highest education level: Not on file  Occupational History  . Not on file  Tobacco Use  . Smoking status: Never Smoker  . Smokeless tobacco: Never Used  Vaping Use  . Vaping Use: Never used  Substance and Sexual Activity  . Alcohol use: No  . Drug use: No  . Sexual activity: Not on file  Other Topics Concern  . Not on file  Social History Narrative   7th grade   Social Determinants of Health   Financial Resource Strain:   . Difficulty of Paying Living Expenses:   Food Insecurity:   . Worried About Programme researcher, broadcasting/film/video in the Last Year:   . Barista in the Last Year:   Transportation Needs:   . Freight forwarder (Medical):   Marland Kitchen Lack of Transportation (Non-Medical):   Physical Activity:   . Days of Exercise per Week:   . Minutes of Exercise per Session:   Stress:   . Feeling of Stress :   Social Connections:   .  Frequency of Communication with Friends and Family:   . Frequency of Social Gatherings with Friends and Family:   . Attends Religious Services:   . Active Member of Clubs or Organizations:   . Attends Banker Meetings:   Marland Kitchen Marital Status:   Intimate Partner Violence:   . Fear of Current or Ex-Partner:   . Emotionally Abused:   Marland Kitchen Physically Abused:   . Sexually Abused:    Family History  Problem Relation Age of Onset  . Asthma Father           Mardella Layman, MD 06/25/20 [phone]

## 2020-06-26 LAB — SARS CORONAVIRUS 2 (TAT 6-24 HRS): SARS Coronavirus 2: NEGATIVE

## 2021-01-17 ENCOUNTER — Encounter (HOSPITAL_COMMUNITY): Payer: Self-pay

## 2021-01-17 ENCOUNTER — Ambulatory Visit (INDEPENDENT_AMBULATORY_CARE_PROVIDER_SITE_OTHER): Payer: 59

## 2021-01-17 ENCOUNTER — Ambulatory Visit (HOSPITAL_COMMUNITY)
Admission: EM | Admit: 2021-01-17 | Discharge: 2021-01-17 | Disposition: A | Discharge status: Home / Self Care | Payer: 59 | Attending: Medical Oncology | Admitting: Medical Oncology

## 2021-01-17 ENCOUNTER — Other Ambulatory Visit: Payer: Self-pay

## 2021-01-17 DIAGNOSIS — M79644 Pain in right finger(s): Secondary | ICD-10-CM

## 2021-01-17 DIAGNOSIS — W19XXXA Unspecified fall, initial encounter: Secondary | ICD-10-CM

## 2021-01-17 DIAGNOSIS — M25561 Pain in right knee: Secondary | ICD-10-CM

## 2021-01-17 MED ORDER — TETANUS-DIPHTH-ACELL PERTUSSIS 5-2.5-18.5 LF-MCG/0.5 IM SUSY
0.5000 mL | PREFILLED_SYRINGE | Freq: Once | INTRAMUSCULAR | Status: AC
  Administered 2021-01-17: 0.5 mL via INTRAMUSCULAR

## 2021-01-17 MED ORDER — IBUPROFEN 800 MG PO TABS: 800.0000 mg | ORAL_TABLET | Freq: Three times a day (TID) | ORAL | 0 refills | Status: DC

## 2021-01-17 MED ORDER — BACITRACIN ZINC 500 UNIT/GM EX OINT
TOPICAL_OINTMENT | CUTANEOUS | Status: AC
  Filled 2021-01-17: qty 0.9

## 2021-01-17 MED ORDER — TETANUS-DIPHTH-ACELL PERTUSSIS 5-2.5-18.5 LF-MCG/0.5 IM SUSY
PREFILLED_SYRINGE | INTRAMUSCULAR | Status: AC
  Filled 2021-01-17: qty 0.5

## 2021-01-17 MED ORDER — BACITRACIN ZINC 500 UNIT/GM EX OINT: TOPICAL_OINTMENT | Freq: Two times a day (BID) | CUTANEOUS | Status: DC
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
## 2021-09-25 ENCOUNTER — Ambulatory Visit (INDEPENDENT_AMBULATORY_CARE_PROVIDER_SITE_OTHER): Payer: 59

## 2021-09-25 ENCOUNTER — Ambulatory Visit (HOSPITAL_COMMUNITY)
Admission: EM | Admit: 2021-09-25 | Discharge: 2021-09-25 | Disposition: A | Discharge status: Home / Self Care | Payer: 59 | Attending: Internal Medicine | Admitting: Internal Medicine

## 2021-09-25 ENCOUNTER — Other Ambulatory Visit: Payer: Self-pay

## 2021-09-25 ENCOUNTER — Encounter (HOSPITAL_COMMUNITY): Payer: Self-pay

## 2021-09-25 DIAGNOSIS — S62346A Nondisplaced fracture of base of fifth metacarpal bone, right hand, initial encounter for closed fracture: Secondary | ICD-10-CM

## 2021-09-25 DIAGNOSIS — M79641 Pain in right hand: Secondary | ICD-10-CM | POA: Diagnosis not present

## 2021-09-25 NOTE — Progress Notes (Signed)
Orthopedic Tech Progress Note Patient Details:  FORREST JAROSZEWSKI 1995/12/16 [id number]  Ortho Devices Type of Ortho Device: Ulna gutter splint Ortho Device/Splint Location: rue Ortho Device/Splint Interventions: Ordered, Application, Adjustment   Post Interventions Patient Tolerated: Well Instructions Provided: Care of device, Poper ambulation with device  Daviona Herbert L Jamina Macbeth 09/25/2021, 6:09 PM

## 2021-09-25 NOTE — Discharge Instructions (Signed)
Stay in splint until seen by specialist.  Use Tylenol and ibuprofen as needed for pain.  Follow-up with specialist as we discussed.  If you have any worsening symptoms you need to be reevaluated as we discussed.
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
## 2021-09-25 NOTE — ED Notes (Signed)
Ortho called for splint placement.  
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
## 2022-01-05 IMAGING — DX DG KNEE COMPLETE 4+V*R*
5 series · 5 of 5 positions shown · non-contrast
Comparison: None.

CLINICAL DATA: Right knee pain after fall last night.

EXAM:
RIGHT KNEE - COMPLETE 4+ VIEW

[knee ap]
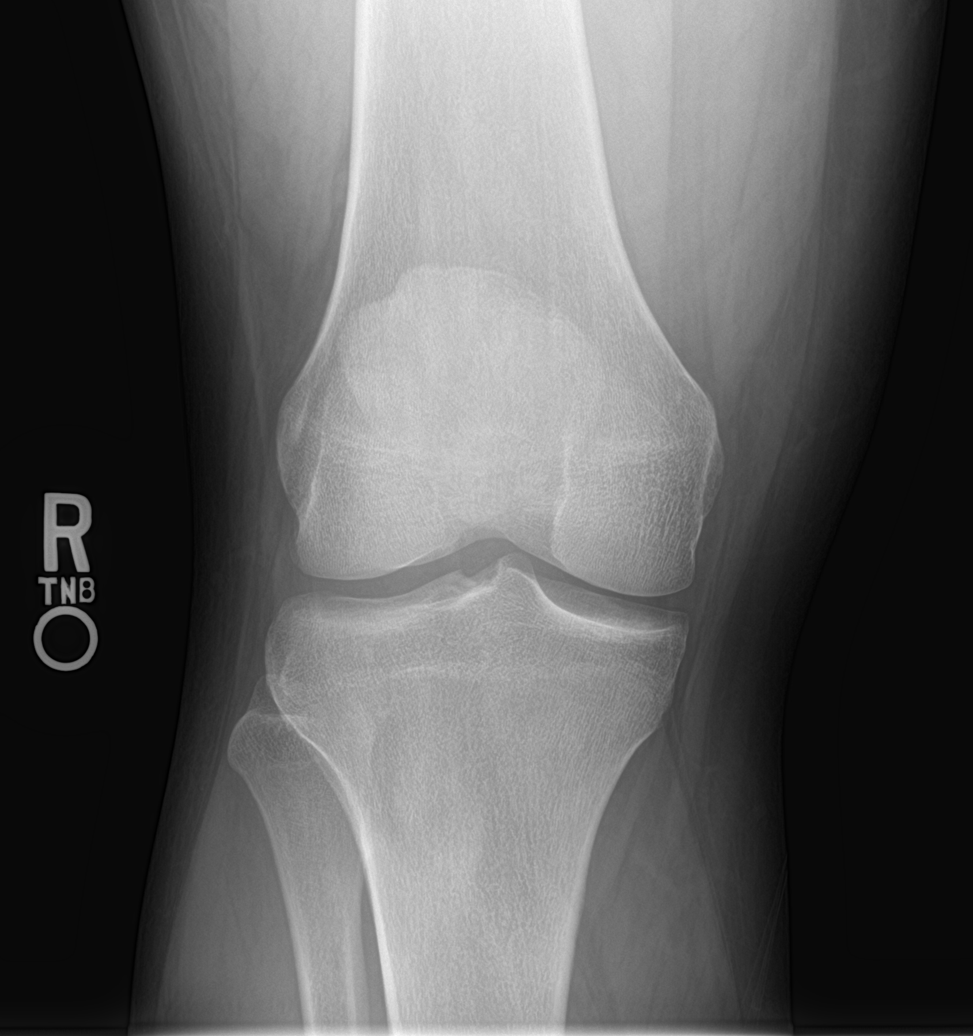

[knee obl (1 of 2)]
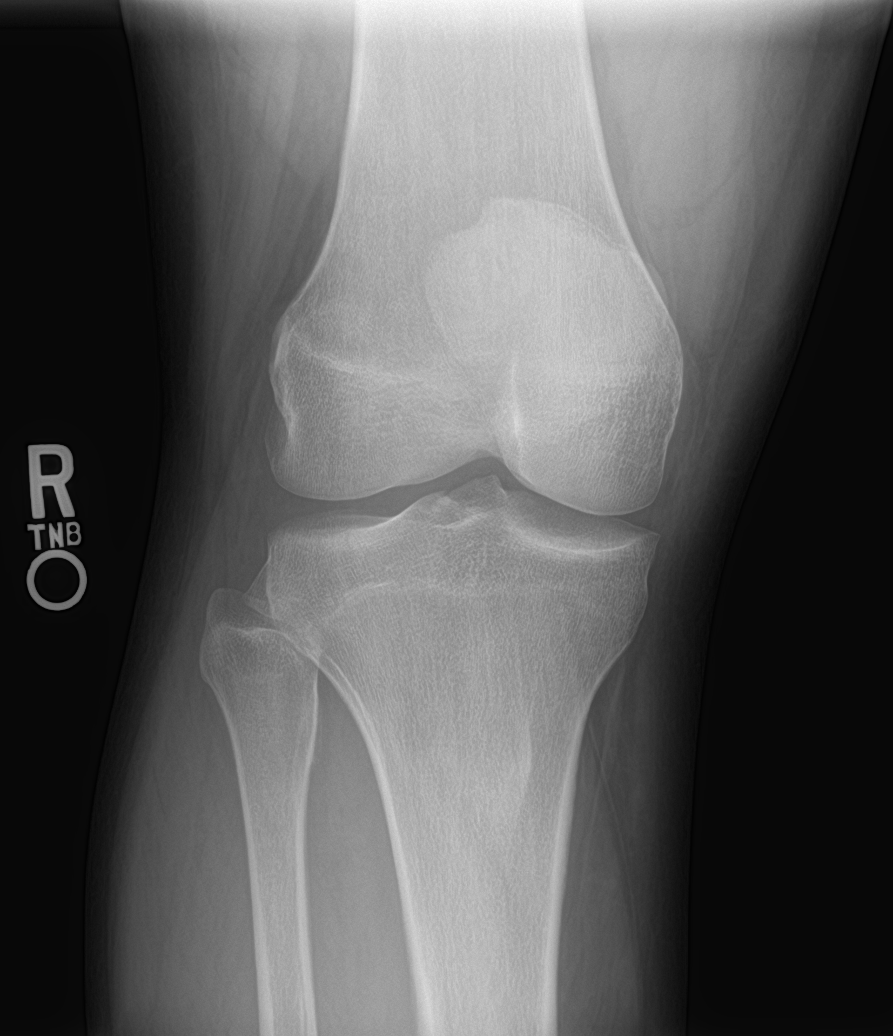

[knee obl (2 of 2)]
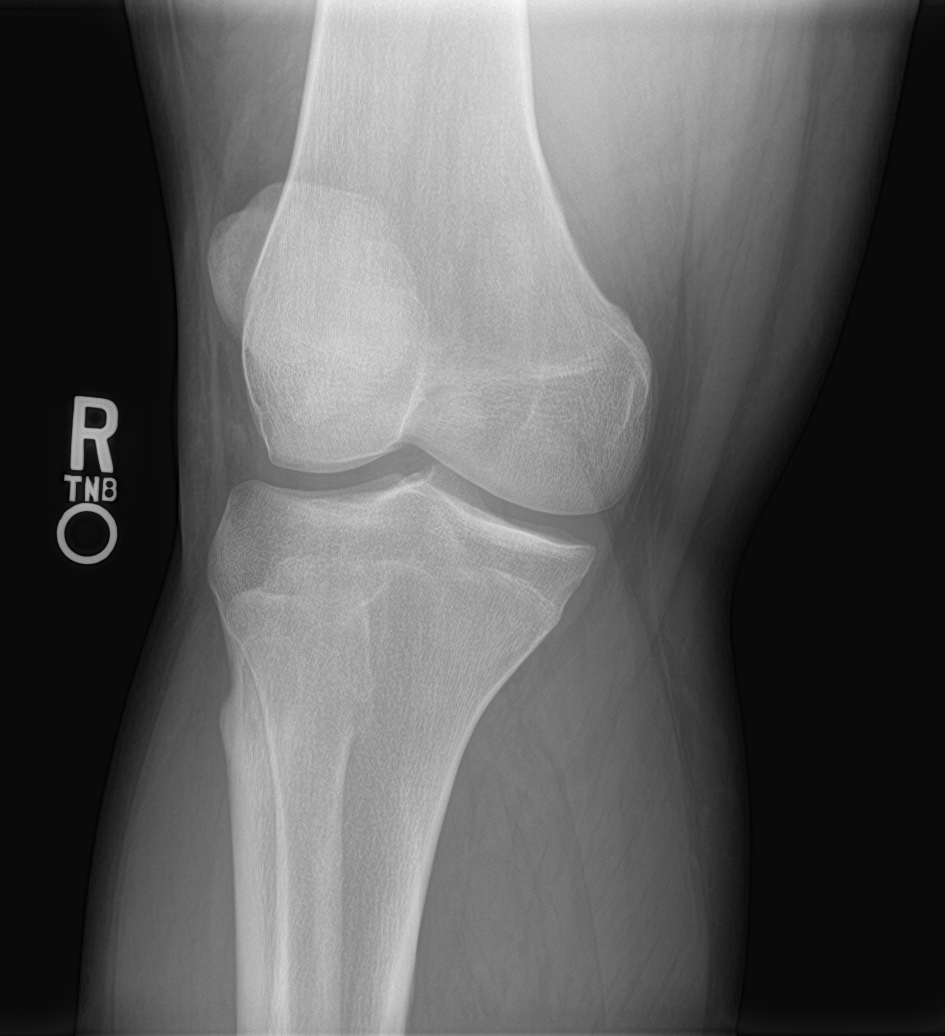

[knee lat]
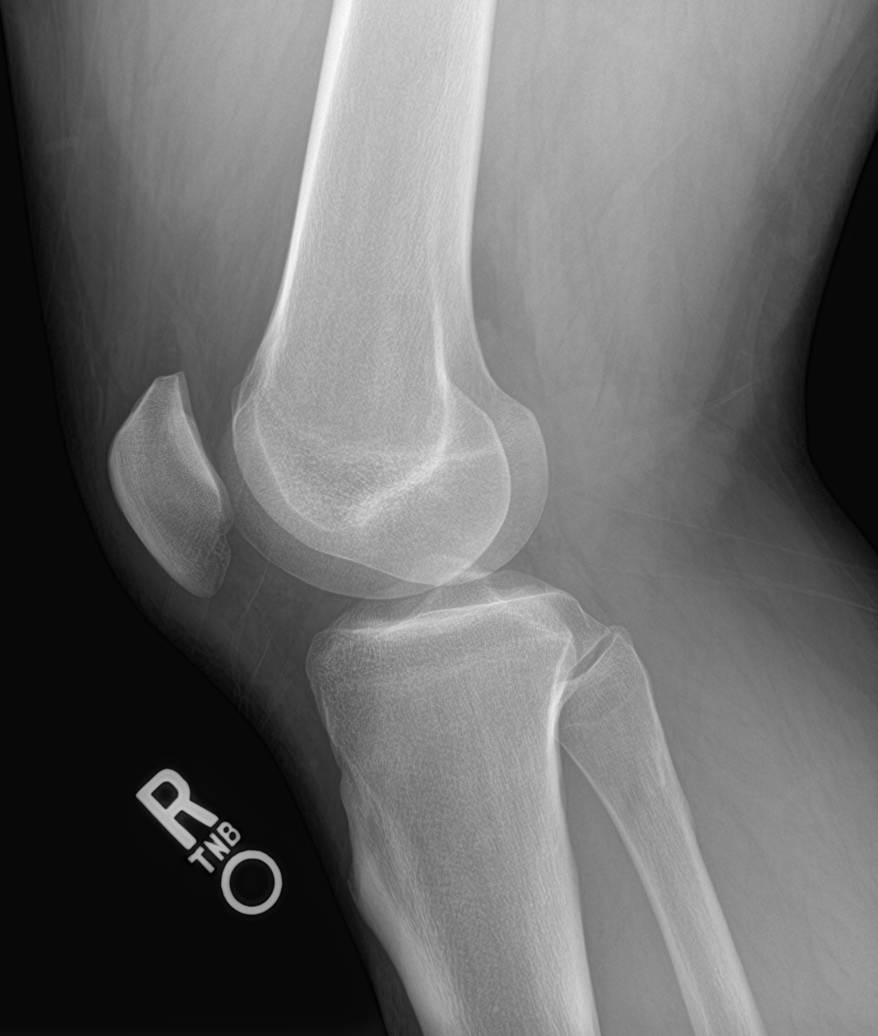

[knee sunrise]
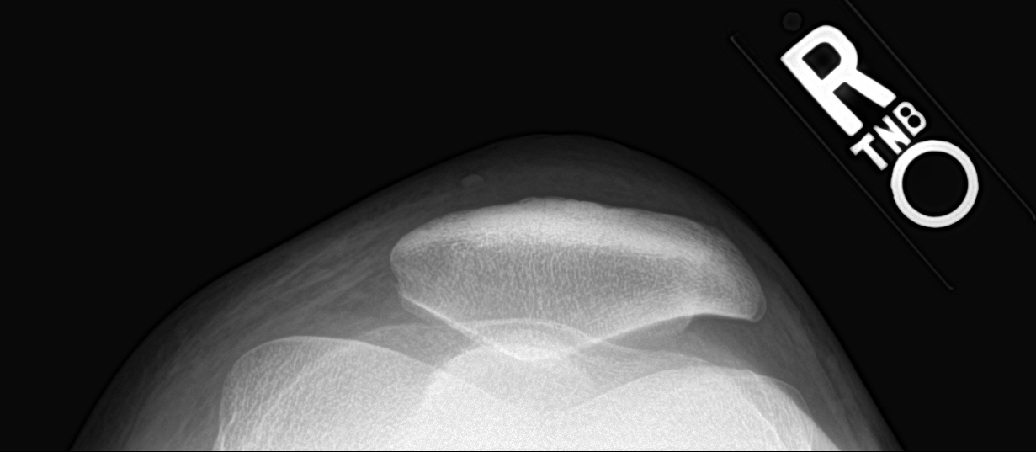

[5 of 5 positions shown; findings below may reference images not displayed]

FINDINGS: No evidence of fracture, dislocation, or joint effusion. No evidence
of arthropathy or other focal bone abnormality. Soft tissues are
unremarkable.
IMPRESSION: Negative.

## 2022-07-18 ENCOUNTER — Ambulatory Visit (HOSPITAL_COMMUNITY)
Admission: EM | Admit: 2022-07-18 | Discharge: 2022-07-18 | Disposition: A | Discharge status: Home / Self Care | Payer: 59 | Attending: Family Medicine | Admitting: Family Medicine

## 2022-07-18 ENCOUNTER — Other Ambulatory Visit: Payer: Self-pay

## 2022-07-18 ENCOUNTER — Ambulatory Visit (INDEPENDENT_AMBULATORY_CARE_PROVIDER_SITE_OTHER): Payer: 59

## 2022-07-18 ENCOUNTER — Encounter (HOSPITAL_COMMUNITY): Payer: Self-pay | Admitting: Emergency Medicine

## 2022-07-18 DIAGNOSIS — M79641 Pain in right hand: Secondary | ICD-10-CM

## 2022-07-18 DIAGNOSIS — M545 Low back pain, unspecified: Secondary | ICD-10-CM | POA: Diagnosis not present

## 2022-07-18 MED ORDER — IBUPROFEN 800 MG PO TABS: 800.0000 mg | ORAL_TABLET | Freq: Three times a day (TID) | ORAL | 0 refills | Status: DC
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
## 2022-07-18 NOTE — Discharge Instructions (Addendum)
Your x-rays did not show any broken bones  Take ibuprofen 800 mg--1 tab every 8 hours as needed for pain.   

## 2022-07-26 ENCOUNTER — Other Ambulatory Visit: Payer: Self-pay

## 2022-07-26 ENCOUNTER — Emergency Department (HOSPITAL_COMMUNITY)
Admission: EM | Admit: 2022-07-26 | Discharge: 2022-07-26 | Disposition: A | Discharge status: Home / Self Care | Payer: 59 | Attending: Emergency Medicine | Admitting: Emergency Medicine

## 2022-07-26 ENCOUNTER — Encounter (HOSPITAL_COMMUNITY): Payer: Self-pay | Admitting: Emergency Medicine

## 2022-07-26 ENCOUNTER — Emergency Department (HOSPITAL_COMMUNITY): Payer: 59

## 2022-07-26 DIAGNOSIS — J45909 Unspecified asthma, uncomplicated: Secondary | ICD-10-CM | POA: Insufficient documentation

## 2022-07-26 DIAGNOSIS — R9389 Abnormal findings on diagnostic imaging of other specified body structures: Secondary | ICD-10-CM | POA: Insufficient documentation

## 2022-07-26 DIAGNOSIS — Z91010 Allergy to peanuts: Secondary | ICD-10-CM | POA: Diagnosis not present

## 2022-07-26 DIAGNOSIS — M545 Low back pain, unspecified: Secondary | ICD-10-CM | POA: Diagnosis present

## 2022-07-26 MED ORDER — LIDOCAINE 5 % EX PTCH
2.0000 | MEDICATED_PATCH | CUTANEOUS | Status: DC
  Administered 2022-07-26: 2 via TRANSDERMAL
  Filled 2022-07-26: qty 2

## 2022-07-26 MED ORDER — METHOCARBAMOL 500 MG PO TABS
500.0000 mg | ORAL_TABLET | Freq: Two times a day (BID) | ORAL | 0 refills | Status: DC
Start: 2022-07-26 — End: 2022-09-14

## 2022-07-26 NOTE — ED Provider Triage Note (Signed)
Emergency Medicine Provider Triage Evaluation Note  Vincent Matthews , a 26 y.o. male  was evaluated in triage.  Pt complains of diffuse lower back pain for the past two weeks after lifting a heavy window. Denies any urinary or fecal incontinence. Denies any IVDU, saddle anesthesia, or fever. He reports the pain is worse with standing. Went to UC last week and was given ibuprofen 800mg .  Review of Systems  Positive:  Negative:   Physical Exam  BP 127/68 (BP Location: Left Arm)   Pulse 60   Temp 97.8 F (36.6 C) (Oral)   Resp 18   SpO2 100%  Gen:   Awake, no distress   Resp:  Normal effort  MSK:   Moves extremities without difficulty  Other:  Diffuse lower back pain. Ambulatory. No neuro deficit noted grossly.   Medical Decision Making  Medically screening exam initiated at 2:07 PM.  Appropriate orders placed.  was informed that the remainder of the evaluation will be completed by another provider, this initial triage assessment does not replace that evaluation, and the importance of remaining in the ED until their evaluation is complete.     Salvadore Oxford, PA-C 07/26/22 1411

## 2022-07-26 NOTE — ED Triage Notes (Signed)
Patient reports lower back pain x1 week after possibly straining a muscle when moving a window at work. He was seen at [facility] d/t back pain and discharged w/ 800 mg ibuprofen, which he states has not been helpful. He reports pain when ambulating and when sitting up.

## 2022-07-26 NOTE — ED Provider Notes (Signed)
[facility]-EMERGENCY DEPT Provider Note   CSN: [id number] Arrival date & time: 07/26/22  1305     History Chief Complaint  Patient presents with   Back Pain    Vincent Matthews is a 26 y.o. male with h/o asthma presents to the ED for evaluation of diffuse lower back pain for the past two weeks. The patient reports that he was lifting a heavy window and started to have lower back pain. He went to urgent care and was placed on 800mg  of ibuprofen which he reports has not been helping. Denies any urinary or fecal incontinence, urinary retention, saddle anesthesia, fever, or IV drug use.  He denies any trouble walking although does have some pain upon standing for prolonged periods of time.  Denies any numbness or tingling.  No known drug allergies.   Back Pain Associated symptoms: no chest pain, no dysuria, no fever, no numbness and no weakness        Home Medications Prior to Admission medications   Medication Sig Start Date End Date Taking? Authorizing Provider  ibuprofen (ADVIL) 800 MG tablet Take 1 tablet (800 mg total) by mouth 3 (three) times daily. 07/18/22   07/20/22, MD  sucralfate (CARAFATE) 1 g tablet Take 1 tablet (1 g total) by mouth 4 (four) times daily -  with meals and at bedtime. Patient not taking: Reported on 08/29/2018 02/05/17 07/04/19  07/06/19, PA-C      Allergies    Fish allergy, Peanut butter flavor, and Peanut-containing drug products    Review of Systems   Review of Systems  Constitutional:  Negative for chills and fever.  Respiratory:  Negative for shortness of breath.   Cardiovascular:  Negative for chest pain.  Genitourinary:  Negative for decreased urine volume, difficulty urinating, dysuria, hematuria and urgency.  Musculoskeletal:  Positive for back pain. Negative for neck pain.  Neurological:  Negative for weakness and numbness.    Physical Exam Updated Vital Signs BP (!) 104/52 (BP Location: Left Arm)   Pulse  (!) 58   Temp 97.6 F (36.4 C) (Oral)   Resp 16   SpO2 98%  Physical Exam Vitals and nursing note reviewed.  Constitutional:      General: He is not in acute distress.    Appearance: Normal appearance. He is not ill-appearing or toxic-appearing.  Eyes:     General: No scleral icterus. Pulmonary:     Effort: Pulmonary effort is normal. No respiratory distress.  Abdominal:     Palpations: Abdomen is soft.     Tenderness: There is no abdominal tenderness. There is no guarding or rebound.  Musculoskeletal:        General: Tenderness present.     Comments: Diffuse lower lumbar tenderness. No point tenderness. No erythema or increased warmth to spin or surrounding areas. No overlying skin changes noted. No step offs or deformities noted. The patient has 5/5 strength to the patient's lower extremities bilaterally. Sensation intact. Compartments are soft.   No tenderness overlying the hips.  Skin:    General: Skin is dry.     Findings: No rash.  Neurological:     General: No focal deficit present.     Mental Status: He is alert. Mental status is at baseline.     Sensory: No sensory deficit.     Motor: No weakness.     Gait: Gait normal.     Comments: Ambulatory with normal gait  Psychiatric:  Mood and Affect: Mood normal.     ED Results / Procedures / Treatments   Labs (all labs ordered are listed, but only abnormal results are displayed) Labs Reviewed - No data to display  EKG None  Radiology DG Lumbar Spine Complete  Result Date: 07/26/2022 CLINICAL DATA:  A 26 year old male presents following back pain after lifting heavy window. EXAM: LUMBAR SPINE - COMPLETE 4+ VIEW COMPARISON:  July 18, 2022 FINDINGS: Five lumbar type vertebral bodies. No sign of vertebral body height loss or static malalignment aside from mild retrolisthesis of L5 on S1 suggested on the current study 2-3 mm. There is some disc space narrowing at this level. Disc spaces are otherwise preserved.  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 IMPRESSION: Minimal retrolisthesis of L5 on S1 with some disc space narrowing at this level. No change since previous imaging. Unusual appearance of cystic changes in the RIGHT femoral head in the absence of significant joint space narrowing. Findings could represent subchondral cyst formation but should be assessed with dedicated hip radiographs particularly if there are symptoms in this location. Other differential considerations would include cystic changes related to late avascular necrosis or cystic bone tumor. Electronically Signed   By: Donzetta Kohut M.D.   On: 07/26/2022 15:09    Procedures Procedures   Medications Ordered in ED Medications - No data to display  ED Course/ Medical Decision Making/ A&P                           Medical Decision Making Amount and/or Complexity of Data Reviewed Radiology: ordered.  Risk Prescription drug management.    26 year old male presents the emergency department for evaluation of diffuse lower back pain.  Differential diagnosis includes was not limited to sprain, strain, MSK, fracture, cauda equina, epidural abscess.  Vital signs show borderline hypotensive and borderline bradycardia consistent with patient's previous history.  Afebrile, normal oxygen saturation.  Please see physical exam note as listed above.  Plain film of DG of the spine shows: Minimal retrolisthesis of L5 on S1 with some disc space narrowing at this level. No change since previous imaging. Unusual appearance of cystic changes in the RIGHT femoral head in the absence of significant joint space narrowing. Findings could represent subchondral cyst formation but should be assessed with dedicated hip radiographs particularly if there are symptoms in this location. Other differential considerations would include cystic changes related to late avascular necrosis or cystic bone tumor.  Patient had a plain film of  his back done on 07-18-2022 and did not have this reading for the subchondral cyst.  I discussed with my attending he reports that he can follow-up outpatient with orthopedics.  Orthopedic provider given into the discharge paperwork.  I discussed with patient the need to call to follow-up for this is seen on his bone as well as for his back pain.  I doubt any cauda equina as patient does not have any urinary fecal incontinence.  No urinary retention.  He is neurovascular intact in his lower extremities.  Doubt any epidural abscess given the patient's denies any IV drug use.  Does not have any overlying erythema or warmth to the spine.  Vital signs not consistent with sepsis.  The patient reports spotty compliance with his ibuprofen.  We will place the patient on a muscle relaxer and recommended that he add Tylenol into his regimen.  Also recommended that he try lidocaine patches over-the-counter.  We discussed strict return precautions and red flag symptoms of back pain.  Patient verbalized understanding and agrees to the plan.  Asking for work note.  Patient's stable and being discharged home in good condition.  I discussed this case with my attending physician who cosigned this note including patient's presenting symptoms, physical exam, and planned diagnostics and interventions. Attending physician stated agreement with plan or made changes to plan which were implemented.   Final Clinical Impression(s) / ED Diagnoses Final diagnoses:  Abnormal finding on imaging  Acute bilateral low back pain without sciatica    Rx / DC Orders ED Discharge Orders          Ordered    methocarbamol (ROBAXIN) 500 MG tablet  2 times daily        07/26/22 1719              Achille Rich, [place] 07/28/22 1520    Gerhard Munch, MD 08/02/22 1523

## 2022-07-26 NOTE — Discharge Instructions (Addendum)
You were seen in the ER for evaluation of your back pain. There was an incidental finding of bone cysts seen on your hips. For this, I would like for you to follow up with an orthopedic provider. I have listed the information for [facility] for which I would like for you to call and schedule an appointment. Additionally, I would like for you to continue with the ibuprofen and add on 1000mg  of Tylenol every 8 hours with the ibuprofen.  Additionally, I am sending home with a muscle relaxer called Robaxin that you can take as needed.  Please do not operate heavy machinery or drive on this medication as it can make you fatigued.  If he were to have any lower leg numbness tingling, numbness to your groin or rectum area, going to the bathroom on yourself, having trouble using the bathroom, fevers, weakness, sensory changes, please return to the nearest emergency department for evaluation.  Contact a health care provider if: You have pain that is not relieved with rest or medicine. You have increasing pain going down into your legs or buttocks. Your pain does not improve after 2 weeks. You have pain at night. You lose weight without trying. You have a fever or chills. You develop nausea or vomiting. You develop abdominal pain. Get help right away if: You develop new bowel or bladder control problems. You have unusual weakness or numbness in your arms or legs. You feel faint. These symptoms may represent a serious problem that is an emergency. Do not wait to see if the symptoms will go away. Get medical help right away. Call your local emergency services (911 in the [place] Do not drive yourself to the hospital.

## 2022-09-14 ENCOUNTER — Encounter (HOSPITAL_COMMUNITY): Payer: Self-pay

## 2022-09-14 ENCOUNTER — Ambulatory Visit (HOSPITAL_COMMUNITY)
Admission: EM | Admit: 2022-09-14 | Discharge: 2022-09-14 | Disposition: A | Discharge status: Home / Self Care | Payer: 59 | Attending: Family Medicine | Admitting: Family Medicine

## 2022-09-14 DIAGNOSIS — J069 Acute upper respiratory infection, unspecified: Secondary | ICD-10-CM | POA: Insufficient documentation

## 2022-09-14 DIAGNOSIS — Z1152 Encounter for screening for COVID-19: Secondary | ICD-10-CM | POA: Insufficient documentation

## 2022-09-14 MED ORDER — ALBUTEROL SULFATE HFA 108 (90 BASE) MCG/ACT IN AERS: 2.0000 | INHALATION_SPRAY | RESPIRATORY_TRACT | 0 refills | Status: AC | PRN

## 2022-09-14 NOTE — ED Notes (Signed)
No answer from lobby  

## 2022-09-14 NOTE — ED Triage Notes (Signed)
Congestion, cough, loss of taste, chills, and night sweats. Onset last night.  No known sick exposure. Patient did come back from the beach and parties.

## 2022-09-14 NOTE — ED Provider Notes (Addendum)
[facility]    CSN: [id number] Arrival date & time: 09/14/22  1241      History   Chief Complaint Chief Complaint  Patient presents with   Cough   Shortness of Breath   Chills    HPI Vincent Matthews is a 26 y.o. male.    Cough Associated symptoms: shortness of breath   Shortness of Breath Associated symptoms: cough    Here for nasal congestion and cough that began on 10/8 and then on 10/9 he lost his sense of taste and smell.  He also started feeling a little short of breath on 10/9.  No fever.  No nausea or vomiting or diarrhea.  He has had some headache  He does have a history of asthma, and is maybe heard some wheezing.  Past Medical History:  Diagnosis Date   Asthma    Dysphagia     Patient Active Problem List   Diagnosis Date Noted   Swallowing difficulty     Past Surgical History:  Procedure Laterality Date   ESOPHAGOGASTRODUODENOSCOPY  05/26/2012   Procedure: ESOPHAGOGASTRODUODENOSCOPY (EGD);  Surgeon: Oletha Blend, MD;  Location: [place];  Service: Gastroenterology;  Laterality: N/A;   ESOPHAGOGASTRODUODENOSCOPY N/A 08/11/2017   Procedure: ESOPHAGOGASTRODUODENOSCOPY (EGD);  Surgeon: Ronnette Juniper, MD;  Location: [place];  Service: Gastroenterology;  Laterality: N/A;   nasal cauterization         Home Medications    Prior to Admission medications   Medication Sig Start Date End Date Taking? Authorizing Provider  albuterol (VENTOLIN HFA) 108 (90 Base) MCG/ACT inhaler Inhale 2 puffs into the lungs every 4 (four) hours as needed for wheezing or shortness of breath. 09/14/22  Yes Barrett Henle, MD  sucralfate (CARAFATE) 1 g tablet Take 1 tablet (1 g total) by mouth 4 (four) times daily -  with meals and at bedtime. Patient not taking: Reported on 08/29/2018 02/05/17 07/04/19  Antonietta Breach, PA-C    Family History Family History  Problem Relation Age of Onset   Asthma Father     Social History Social History   Tobacco Use    Smoking status: Some Days    Types: Cigarettes, Cigars   Smokeless tobacco: Never  Vaping Use   Vaping Use: Some days  Substance Use Topics   Alcohol use: Not Currently    Comment: rare   Drug use: No     Allergies   Fish allergy, Peanut butter flavor, and Peanut-containing drug products   Review of Systems Review of Systems  Respiratory:  Positive for cough and shortness of breath.      Physical Exam Triage Vital Signs ED Triage Vitals  Enc Vitals Group     BP 09/14/22 1355 123/80     Pulse Rate 09/14/22 1355 63     Resp 09/14/22 1355 16     Temp 09/14/22 1355 98.4 F (36.9 C)     Temp Source 09/14/22 1355 Oral     SpO2 09/14/22 1355 100 %     Weight --      Height --      Head Circumference --      Peak Flow --      Pain Score 09/14/22 1358 0     Pain Loc --      Pain Edu? --      Excl. in [place]? --    No data found.  Updated Vital Signs BP 123/80 (BP Location: Right Arm)   Pulse 63  Temp 98.4 F (36.9 C) (Oral)   Resp 16   SpO2 100%   Visual Acuity Right Eye Distance:   Left Eye Distance:   Bilateral Distance:    Right Eye Near:   Left Eye Near:    Bilateral Near:     Physical Exam Vitals reviewed.  Constitutional:      General: He is not in acute distress.    Appearance: He is not ill-appearing, toxic-appearing or diaphoretic.  HENT:     Right Ear: Tympanic membrane and ear canal normal.     Left Ear: Tympanic membrane and ear canal normal.     Nose: Congestion present.     Mouth/Throat:     Mouth: Mucous membranes are moist.     Pharynx: No oropharyngeal exudate or posterior oropharyngeal erythema.  Eyes:     Extraocular Movements: Extraocular movements intact.     Conjunctiva/sclera: Conjunctivae normal.     Pupils: Pupils are equal, round, and reactive to light.  Cardiovascular:     Rate and Rhythm: Normal rate and regular rhythm.     Heart sounds: No murmur heard. Pulmonary:     Effort: No respiratory distress.     Breath sounds:  No stridor. No wheezing, rhonchi or rales.  Musculoskeletal:     Cervical back: Neck supple.  Lymphadenopathy:     Cervical: No cervical adenopathy.  Skin:    Capillary Refill: Capillary refill takes less than 2 seconds.     Coloration: Skin is not jaundiced or pale.  Neurological:     General: No focal deficit present.     Mental Status: He is alert and oriented to person, place, and time.  Psychiatric:        Behavior: Behavior normal.      UC Treatments / Results  Labs (all labs ordered are listed, but only abnormal results are displayed) Labs Reviewed  SARS CORONAVIRUS 2 (TAT 6-24 HRS)    EKG   Radiology No results found.  Procedures Procedures (including critical care time)  Medications Ordered in UC Medications - No data to display  Initial Impression / Assessment and Plan / UC Course  I have reviewed the triage vital signs and the nursing notes.  Pertinent labs & imaging results that were available during my care of the patient were reviewed by me and considered in my medical decision making (see chart for details).        COVID swab will be done today.  If he is positive, he is a candidate for Paxlovid.  His last EGFR was over 60.  On sending in albuterol inhaler to use as needed, but with his normal exam here, I do not think that he is in an exacerbation of his asthma at present.  Therefore, at present I am not sending in any steroids Final Clinical Impressions(s) / UC Diagnoses   Final diagnoses:  Viral upper respiratory tract infection     Discharge Instructions      Albuterol inhaler--do 2 puffs every 4 hours as needed for shortness of breath or wheezing   You have been swabbed for COVID, and the test will result in the next 24 hours. Our staff will call you if positive. If the test is positive, you should quarantine for 5 days from the start of your symptoms      ED Prescriptions     Medication Sig Dispense Auth. Provider   albuterol  (VENTOLIN HFA) 108 (90 Base) MCG/ACT inhaler Inhale 2 puffs into the lungs  every 4 (four) hours as needed for wheezing or shortness of breath. 1 each Barrett Henle, MD      PDMP not reviewed this encounter.   Barrett Henle, MD 09/14/22 1413    Barrett Henle, MD 09/14/22 [phone]

## 2022-09-14 NOTE — Discharge Instructions (Addendum)
Albuterol inhaler--do 2 puffs every 4 hours as needed for shortness of breath or wheezing   You have been swabbed for COVID, and the test will result in the next 24 hours. Our staff will call you if positive. If the test is positive, you should quarantine for 5 days from the start of your symptoms

## 2022-09-15 LAB — SARS CORONAVIRUS 2 (TAT 6-24 HRS): SARS Coronavirus 2: NEGATIVE

## 2023-02-15 ENCOUNTER — Ambulatory Visit (HOSPITAL_COMMUNITY)
Admission: EM | Admit: 2023-02-15 | Discharge: 2023-02-15 | Disposition: A | Discharge status: Home / Self Care | Payer: 59 | Attending: Family Medicine | Admitting: Family Medicine

## 2023-02-15 ENCOUNTER — Encounter (HOSPITAL_COMMUNITY): Payer: Self-pay

## 2023-02-15 ENCOUNTER — Other Ambulatory Visit: Payer: Self-pay

## 2023-02-15 DIAGNOSIS — S76312A Strain of muscle, fascia and tendon of the posterior muscle group at thigh level, left thigh, initial encounter: Secondary | ICD-10-CM | POA: Diagnosis not present

## 2023-02-15 MED ORDER — CELECOXIB 100 MG PO CAPS
100.0000 mg | ORAL_CAPSULE | Freq: Two times a day (BID) | ORAL | 0 refills | Status: AC
Start: 2023-02-15 — End: 2023-02-25

## 2023-02-15 MED ORDER — PREDNISONE 10 MG (21) PO TBPK
ORAL_TABLET | Freq: Every day | ORAL | 0 refills | Status: DC
Start: 2023-02-15 — End: 2023-11-18

## 2023-02-15 NOTE — ED Triage Notes (Signed)
Pt presents with complaints of possible sprained muscle in his left hamstring. He feels a pull in his leg when he walks. He is also having pain in the arch of his left foot x 4 days. Foot pain is on and off.

## 2023-02-15 NOTE — ED Provider Notes (Signed)
[facility]    CSN: [id number] Arrival date & time: 02/15/23  0947      History   Chief Complaint Chief Complaint  Patient presents with   Leg Pain    HPI Vincent Matthews is a 27 y.o. male.   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  Reports pain primarily in the mid to upper hamstring. PMH Dysphagia an asthma  Past Medical History:  Diagnosis Date   Asthma    Dysphagia     Patient Active Problem List   Diagnosis Date Noted   Swallowing difficulty     Past Surgical History:  Procedure Laterality Date   ESOPHAGOGASTRODUODENOSCOPY  05/26/2012   Procedure: ESOPHAGOGASTRODUODENOSCOPY (EGD);  Surgeon: Oletha Blend, MD;  Location: [place];  Service: Gastroenterology;  Laterality: N/A;   ESOPHAGOGASTRODUODENOSCOPY N/A 08/11/2017   Procedure: ESOPHAGOGASTRODUODENOSCOPY (EGD);  Surgeon: Ronnette Juniper, MD;  Location: [place];  Service: Gastroenterology;  Laterality: N/A;   nasal cauterization         Home Medications    Prior to Admission medications   Medication Sig Start Date End Date Taking? Authorizing Provider  celecoxib (CELEBREX) 100 MG capsule Take 1 capsule (100 mg total) by mouth 2 (two) times daily for 10 days. 02/15/23 02/25/23 Yes Eliezer Lofts, FNP  predniSONE (STERAPRED UNI-PAK 21 TAB) 10 MG (21) TBPK tablet Take by mouth daily. Take 6 tabs by mouth daily  for 2 days, then 5 tabs for 2 days, then 4 tabs for 2 days, then 3 tabs for 2 days, 2 tabs for 2 days, then 1 tab by mouth daily for 2 days 02/15/23  Yes Eliezer Lofts, FNP  albuterol (VENTOLIN HFA) 108 (90 Base) MCG/ACT inhaler Inhale 2 puffs into the lungs every 4 (four) hours as needed for wheezing or shortness of breath. 09/14/22   Barrett Henle, MD  sucralfate (CARAFATE) 1 g tablet Take 1 tablet (1 g total) by mouth 4 (four) times daily -  with meals and at bedtime. Patient not taking:  Reported on 08/29/2018 02/05/17 07/04/19  Antonietta Breach, PA-C    Family History Family History  Problem Relation Age of Onset   Asthma Father     Social History Social History   Tobacco Use   Smoking status: Some Days    Types: Cigarettes, Cigars   Smokeless tobacco: Never  Vaping Use   Vaping Use: Some days  Substance Use Topics   Alcohol use: Not Currently    Comment: rare   Drug use: No     Allergies   Fish allergy, Peanut butter flavor, and Peanut-containing drug products   Review of Systems Review of Systems  Musculoskeletal:        Left leg strain/left foot pain  All other systems reviewed and are negative.    Physical Exam Triage Vital Signs ED Triage Vitals  Enc Vitals Group     BP      Pulse      Resp      Temp      Temp src      SpO2      Weight      Height      Head Circumference      Peak Flow      Pain Score      Pain Loc      Pain Edu?      Excl.  in [place]?    No data found.  Updated Vital Signs BP 114/66   Pulse 66   Temp 97.7 F (36.5 C)   Resp 18   SpO2 99%       Physical Exam Vitals and nursing note reviewed.  Constitutional:      Appearance: Normal appearance. He is obese.  HENT:     Head: Normocephalic and atraumatic.     Mouth/Throat:     Mouth: Mucous membranes are moist.     Pharynx: Oropharynx is clear.  Eyes:     Extraocular Movements: Extraocular movements intact.     Conjunctiva/sclera: Conjunctivae normal.     Pupils: Pupils are equal, round, and reactive to light.  Cardiovascular:     Rate and Rhythm: Normal rate and regular rhythm.     Pulses: Normal pulses.     Heart sounds: Normal heart sounds.  Pulmonary:     Effort: Pulmonary effort is normal.     Breath sounds: Normal breath sounds. No wheezing, rhonchi or rales.  Musculoskeletal:        General: Normal range of motion.     Cervical back: Normal range of motion and neck supple.     Comments: Left upper leg (posterior superior aspect): TTP over biceps  femoris/medial semitendinosus/superior lateral hamstring tendon insertion site, limited range of motion no deformity noted  Skin:    General: Skin is warm and dry.  Neurological:     General: No focal deficit present.     Mental Status: He is alert and oriented to person, place, and time. Mental status is at baseline.      UC Treatments / Results  Labs (all labs ordered are listed, but only abnormal results are displayed) Labs Reviewed - No data to display  EKG   Radiology No results found.  Procedures Procedures (including critical care time)  Medications Ordered in UC Medications - No data to display  Initial Impression / Assessment and Plan / UC Course  I have reviewed the triage vital signs and the nursing notes.  Pertinent labs & imaging results that were available during my care of the patient were reviewed by me and considered in my medical decision making (see chart for details).     MDM: 1.  Strain of left hamstring muscle, initial encounter-Rx Celebrex, Sterapred [place], advised [facility] Orthopedic evaluation if symptoms worsen.  and/or unresolved work note provided to patient prior to discharge per request. Patient to take medication as directed with food to completion.  Advised patient to take prednisone with first dose of Celebrex for the next 10 days.  Encouraged increase daily water intake to 64 ounces per day while taking these medications.  Advised/encouraged patient to avoid offending activities involving affected area of left upper hamstring for the next 7 to 10 days.  Advised if symptoms worsen and/or unresolved please follow-up with PCP or [facility] orthopedic provider for further evaluation.  Patient is discharged home hemodynamically stable. Final Clinical Impressions(s) / UC Diagnoses   Final diagnoses:  Strain of left hamstring muscle, initial encounter     Discharge Instructions      Patient to take medication as directed with food to completion.   Advised patient to take prednisone with first dose of Celebrex for the next 10 days.  Encouraged increase daily water intake to 64 ounces per day while taking these medications.  Advised/encouraged patient to avoid offending activities involving affected area of left upper hamstring for the next 7 to 10 days.  Advised if symptoms worsen and/or unresolved please follow-up with PCP or [facility] orthopedic provider for further evaluation.     ED Prescriptions     Medication Sig Dispense Auth. Provider   predniSONE (STERAPRED UNI-PAK 21 TAB) 10 MG (21) TBPK tablet Take by mouth daily. Take 6 tabs by mouth daily  for 2 days, then 5 tabs for 2 days, then 4 tabs for 2 days, then 3 tabs for 2 days, 2 tabs for 2 days, then 1 tab by mouth daily for 2 days 42 tablet Eliezer Lofts, FNP   celecoxib (CELEBREX) 100 MG capsule Take 1 capsule (100 mg total) by mouth 2 (two) times daily for 10 days. 20 capsule Eliezer Lofts, FNP      PDMP not reviewed this encounter.   Eliezer Lofts, [place] 02/15/23 1105

## 2023-02-15 NOTE — Discharge Instructions (Addendum)
Patient to take medication as directed with food to completion.  Advised patient to take prednisone with first dose of Celebrex for the next 10 days.  Encouraged increase daily water intake to 64 ounces per day while taking these medications.  Advised/encouraged patient to avoid offending activities involving affected area of left upper hamstring for the next 7 to 10 days.  Advised if symptoms worsen and/or unresolved please follow-up with PCP or [facility] orthopedic provider for further evaluation.

## 2023-05-18 ENCOUNTER — Ambulatory Visit: Payer: 59 | Admitting: Nurse Practitioner

## 2023-10-29 ENCOUNTER — Emergency Department (HOSPITAL_COMMUNITY): Payer: 59

## 2023-10-29 ENCOUNTER — Encounter (HOSPITAL_COMMUNITY): Payer: Self-pay

## 2023-10-29 ENCOUNTER — Other Ambulatory Visit: Payer: Self-pay

## 2023-10-29 ENCOUNTER — Emergency Department (HOSPITAL_COMMUNITY)
Admission: EM | Admit: 2023-10-29 | Discharge: 2023-10-29 | Disposition: A | Discharge status: Home / Self Care | Payer: 59 | Attending: Emergency Medicine | Admitting: Emergency Medicine

## 2023-10-29 DIAGNOSIS — W501XXA Accidental kick by another person, initial encounter: Secondary | ICD-10-CM | POA: Diagnosis not present

## 2023-10-29 DIAGNOSIS — M25561 Pain in right knee: Secondary | ICD-10-CM | POA: Insufficient documentation

## 2023-10-29 MED ORDER — IBUPROFEN 800 MG PO TABS
800.0000 mg | ORAL_TABLET | Freq: Once | ORAL | Status: AC
  Administered 2023-10-29: 800 mg via ORAL
  Filled 2023-10-29: qty 1

## 2023-10-29 MED ORDER — NAPROXEN 500 MG PO TABS: 500.0000 mg | ORAL_TABLET | Freq: Two times a day (BID) | ORAL | 0 refills | Status: DC
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
## 2023-10-29 NOTE — Discharge Instructions (Signed)
You should wear the brace and use crutches until you are cleared by orthopedics.    Take medications as prescribed.

## 2023-10-29 NOTE — Progress Notes (Signed)
Orthopedic Tech Progress Note Patient Details:  Vincent Matthews December 10, 1995 [id number]  Ortho Devices Type of Ortho Device: Knee Immobilizer Ortho Device/Splint Location: RLE Ortho Device/Splint Interventions: Ordered, Application, Adjustment   Post Interventions Patient Tolerated: Well, Ambulated well Instructions Provided: Care of device  [name] 10/29/2023, 5:49 AM

## 2023-11-17 ENCOUNTER — Inpatient Hospital Stay (HOSPITAL_COMMUNITY): Payer: 59 | Admitting: Certified Registered Nurse Anesthetist

## 2023-11-17 ENCOUNTER — Observation Stay (HOSPITAL_COMMUNITY)
Admission: EM | Admit: 2023-11-17 | Discharge: 2023-11-18 | Disposition: A | Discharge status: Home / Self Care | Payer: 59 | Attending: Surgery | Admitting: Surgery

## 2023-11-17 ENCOUNTER — Encounter (HOSPITAL_COMMUNITY): Admission: EM | Disposition: A | Discharge status: Home / Self Care | Payer: Self-pay | Source: Home / Self Care

## 2023-11-17 ENCOUNTER — Other Ambulatory Visit: Payer: Self-pay

## 2023-11-17 ENCOUNTER — Encounter (HOSPITAL_COMMUNITY): Payer: Self-pay

## 2023-11-17 ENCOUNTER — Ambulatory Visit (HOSPITAL_COMMUNITY)
Admission: EM | Admit: 2023-11-17 | Discharge: 2023-11-17 | Disposition: A | Discharge status: Home / Self Care | Payer: 59 | Attending: Internal Medicine | Admitting: Internal Medicine

## 2023-11-17 ENCOUNTER — Emergency Department (HOSPITAL_COMMUNITY): Payer: 59

## 2023-11-17 DIAGNOSIS — Z9049 Acquired absence of other specified parts of digestive tract: Secondary | ICD-10-CM

## 2023-11-17 DIAGNOSIS — K353 Acute appendicitis with localized peritonitis, without perforation or gangrene: Principal | ICD-10-CM | POA: Insufficient documentation

## 2023-11-17 DIAGNOSIS — Z79899 Other long term (current) drug therapy: Secondary | ICD-10-CM | POA: Diagnosis not present

## 2023-11-17 DIAGNOSIS — R1030 Lower abdominal pain, unspecified: Secondary | ICD-10-CM | POA: Diagnosis present

## 2023-11-17 DIAGNOSIS — K3580 Unspecified acute appendicitis: Secondary | ICD-10-CM

## 2023-11-17 DIAGNOSIS — R1031 Right lower quadrant pain: Secondary | ICD-10-CM | POA: Diagnosis not present

## 2023-11-17 DIAGNOSIS — F17210 Nicotine dependence, cigarettes, uncomplicated: Secondary | ICD-10-CM | POA: Diagnosis not present

## 2023-11-17 DIAGNOSIS — J45909 Unspecified asthma, uncomplicated: Secondary | ICD-10-CM | POA: Diagnosis not present

## 2023-11-17 DIAGNOSIS — K37 Unspecified appendicitis: Secondary | ICD-10-CM | POA: Diagnosis present

## 2023-11-17 HISTORY — PX: LAPAROSCOPIC APPENDECTOMY: SHX408

## 2023-11-17 LAB — COMPREHENSIVE METABOLIC PANEL
ALT: 15 U/L (ref 0–44)
AST: 21 U/L (ref 15–41)
Albumin: 4 g/dL (ref 3.5–5.0)
Alkaline Phosphatase: 55 U/L (ref 38–126)
Anion gap: 11 (ref 5–15)
BUN: 14 mg/dL (ref 6–20)
CO2: 22 mmol/L (ref 22–32)
Calcium: 9.3 mg/dL (ref 8.9–10.3)
Chloride: 108 mmol/L (ref 98–111)
Creatinine, Ser: 0.93 mg/dL (ref 0.61–1.24)
GFR, Estimated: >60 mL/min (ref >60)
Glucose, Bld: 117 mg/dL — ABNORMAL HIGH (ref 70–99)
Potassium: 3.8 mmol/L (ref 3.5–5.1)
Sodium: 141 mmol/L (ref 135–145)
Total Bilirubin: 0.7 mg/dL (ref <1.2)
Total Protein: 7.6 g/dL (ref 6.5–8.1)

## 2023-11-17 LAB — CBC WITH DIFFERENTIAL/PLATELET
Abs Immature Granulocytes: 0.03 10*3/uL (ref 0.00–0.07)
Basophils Absolute: 0 10*3/uL (ref 0.0–0.1)
Basophils Relative: 0 %
Eosinophils Absolute: 0 10*3/uL (ref 0.0–0.5)
Eosinophils Relative: 0 %
HCT: 47.2 % (ref 39.0–52.0)
Hemoglobin: 15.5 g/dL (ref 13.0–17.0)
Immature Granulocytes: 0 %
Lymphocytes Relative: 9 %
Lymphs Abs: 0.9 10*3/uL (ref 0.7–4.0)
MCH: 27.9 pg (ref 26.0–34.0)
MCHC: 32.8 g/dL (ref 30.0–36.0)
MCV: 85 fL (ref 80.0–100.0)
Monocytes Absolute: 0.7 10*3/uL (ref 0.1–1.0)
Monocytes Relative: 7 %
Neutro Abs: 8.3 10*3/uL — ABNORMAL HIGH (ref 1.7–7.7)
Neutrophils Relative %: 84 %
Platelets: 177 10*3/uL (ref 150–400)
RBC: 5.55 MIL/uL (ref 4.22–5.81)
RDW: 12.3 % (ref 11.5–15.5)
WBC: 9.9 10*3/uL (ref 4.0–10.5)
nRBC: 0 % (ref 0.0–0.2)

## 2023-11-17 LAB — LIPASE, BLOOD: Lipase: 23 U/L (ref 11–51)

## 2023-11-17 SURGERY — APPENDECTOMY, LAPAROSCOPIC
Anesthesia: General | Site: Abdomen

## 2023-11-17 MED ORDER — ACETAMINOPHEN 10 MG/ML IV SOLN
INTRAVENOUS | Status: DC | PRN
  Administered 2023-11-17: 1000 mg via INTRAVENOUS

## 2023-11-17 MED ORDER — IOHEXOL 350 MG/ML SOLN
75.0000 mL | Freq: Once | INTRAVENOUS | Status: AC | PRN
  Administered 2023-11-17: 75 mL via INTRAVENOUS

## 2023-11-17 MED ORDER — DEXMEDETOMIDINE HCL IN NACL 80 MCG/20ML IV SOLN
INTRAVENOUS | Status: DC | PRN
  Administered 2023-11-17: 4 ug via INTRAVENOUS
  Administered 2023-11-17 (×3): 8 ug via INTRAVENOUS
  Administered 2023-11-17: 4 ug via INTRAVENOUS
  Administered 2023-11-17: 8 ug via INTRAVENOUS

## 2023-11-17 MED ORDER — ROCURONIUM BROMIDE 100 MG/10ML IV SOLN
INTRAVENOUS | Status: DC | PRN
  Administered 2023-11-17: 60 mg via INTRAVENOUS

## 2023-11-17 MED ORDER — ACETAMINOPHEN 10 MG/ML IV SOLN
INTRAVENOUS | Status: AC
  Filled 2023-11-17: qty 100

## 2023-11-17 MED ORDER — SUCCINYLCHOLINE CHLORIDE 200 MG/10ML IV SOSY
PREFILLED_SYRINGE | INTRAVENOUS | Status: DC | PRN
  Administered 2023-11-17: 140 mg via INTRAVENOUS

## 2023-11-17 MED ORDER — BUPIVACAINE-EPINEPHRINE 0.25% -1:200000 IJ SOLN
INTRAMUSCULAR | Status: DC | PRN
  Administered 2023-11-17: 20 mL

## 2023-11-17 MED ORDER — SUGAMMADEX SODIUM 200 MG/2ML IV SOLN
INTRAVENOUS | Status: DC | PRN
  Administered 2023-11-17: 400 mg via INTRAVENOUS

## 2023-11-17 MED ORDER — PHENYLEPHRINE HCL (PRESSORS) 10 MG/ML IV SOLN
INTRAVENOUS | Status: DC | PRN
  Administered 2023-11-17: 160 ug via INTRAVENOUS

## 2023-11-17 MED ORDER — KCL IN DEXTROSE-NACL 20-5-0.45 MEQ/L-%-% IV SOLN
INTRAVENOUS | Status: DC
  Filled 2023-11-17 (×2): qty 1000

## 2023-11-17 MED ORDER — OXYCODONE HCL 5 MG/5ML PO SOLN: 5.0000 mg | Freq: Once | ORAL | Status: DC | PRN

## 2023-11-17 MED ORDER — FENTANYL CITRATE (PF) 100 MCG/2ML IJ SOLN: 25.0000 ug | INTRAMUSCULAR | Status: DC | PRN

## 2023-11-17 MED ORDER — SODIUM CHLORIDE 0.9 % IV SOLN
2.0000 g | Freq: Once | INTRAVENOUS | Status: AC
  Administered 2023-11-17: 2 g via INTRAVENOUS
  Filled 2023-11-17: qty 20

## 2023-11-17 MED ORDER — MIDAZOLAM HCL 5 MG/5ML IJ SOLN
INTRAMUSCULAR | Status: DC | PRN
  Administered 2023-11-17: 2 mg via INTRAVENOUS

## 2023-11-17 MED ORDER — DROPERIDOL 2.5 MG/ML IJ SOLN: 0.6250 mg | Freq: Once | INTRAMUSCULAR | Status: DC | PRN

## 2023-11-17 MED ORDER — METRONIDAZOLE 500 MG/100ML IV SOLN
500.0000 mg | Freq: Once | INTRAVENOUS | Status: AC
  Administered 2023-11-17: 500 mg via INTRAVENOUS
  Filled 2023-11-17: qty 100

## 2023-11-17 MED ORDER — PIPERACILLIN-TAZOBACTAM 3.375 G IVPB 30 MIN: 3.3750 g | Freq: Once | INTRAVENOUS | Status: DC

## 2023-11-17 MED ORDER — ACETAMINOPHEN 500 MG PO TABS
1000.0000 mg | ORAL_TABLET | Freq: Four times a day (QID) | ORAL | Status: DC
Start: 2023-11-18 — End: 2023-11-18
  Administered 2023-11-18 (×2): 1000 mg via ORAL
  Filled 2023-11-17 (×2): qty 2

## 2023-11-17 MED ORDER — ONDANSETRON HCL 4 MG/2ML IJ SOLN: 4.0000 mg | Freq: Four times a day (QID) | INTRAMUSCULAR | Status: DC | PRN

## 2023-11-17 MED ORDER — FENTANYL CITRATE (PF) 250 MCG/5ML IJ SOLN
INTRAMUSCULAR | Status: AC
  Filled 2023-11-17: qty 5

## 2023-11-17 MED ORDER — SODIUM CHLORIDE 0.9 % IV SOLN: INTRAVENOUS | Status: DC | PRN

## 2023-11-17 MED ORDER — ONDANSETRON 4 MG PO TBDP: 4.0000 mg | ORAL_TABLET | Freq: Four times a day (QID) | ORAL | Status: DC | PRN

## 2023-11-17 MED ORDER — ONDANSETRON 4 MG PO TBDP
4.0000 mg | ORAL_TABLET | Freq: Once | ORAL | Status: AC
Start: 2023-11-17 — End: 2023-11-17
  Administered 2023-11-17: 4 mg via ORAL

## 2023-11-17 MED ORDER — DIPHENHYDRAMINE HCL 12.5 MG/5ML PO ELIX: 12.5000 mg | ORAL_SOLUTION | Freq: Four times a day (QID) | ORAL | Status: DC | PRN

## 2023-11-17 MED ORDER — LIDOCAINE HCL (CARDIAC) PF 100 MG/5ML IV SOSY
PREFILLED_SYRINGE | INTRAVENOUS | Status: DC | PRN
  Administered 2023-11-17: 100 mg via INTRAVENOUS

## 2023-11-17 MED ORDER — ONDANSETRON HCL 4 MG/2ML IJ SOLN
4.0000 mg | Freq: Once | INTRAMUSCULAR | Status: AC
  Administered 2023-11-17: 4 mg via INTRAVENOUS
  Filled 2023-11-17: qty 2

## 2023-11-17 MED ORDER — PROPOFOL 10 MG/ML IV BOLUS
INTRAVENOUS | Status: AC
Start: 2023-11-17 — End: ?
  Filled 2023-11-17: qty 20

## 2023-11-17 MED ORDER — LIDOCAINE 2% (20 MG/ML) 5 ML SYRINGE
INTRAMUSCULAR | Status: AC
Start: 2023-11-17 — End: ?
  Filled 2023-11-17: qty 5

## 2023-11-17 MED ORDER — ENOXAPARIN SODIUM 40 MG/0.4ML IJ SOSY
40.0000 mg | PREFILLED_SYRINGE | INTRAMUSCULAR | Status: DC
Start: 2023-11-18 — End: 2023-11-18
  Administered 2023-11-18: 40 mg via SUBCUTANEOUS
  Filled 2023-11-17: qty 0.4

## 2023-11-17 MED ORDER — ONDANSETRON 4 MG PO TBDP
ORAL_TABLET | ORAL | Status: AC
  Filled 2023-11-17: qty 1

## 2023-11-17 MED ORDER — SUCCINYLCHOLINE CHLORIDE 200 MG/10ML IV SOSY
PREFILLED_SYRINGE | INTRAVENOUS | Status: AC
  Filled 2023-11-17: qty 10

## 2023-11-17 MED ORDER — DIPHENHYDRAMINE HCL 50 MG/ML IJ SOLN: 12.5000 mg | Freq: Four times a day (QID) | INTRAMUSCULAR | Status: DC | PRN

## 2023-11-17 MED ORDER — DEXAMETHASONE SODIUM PHOSPHATE 10 MG/ML IJ SOLN
INTRAMUSCULAR | Status: DC | PRN
  Administered 2023-11-17: 5 mg via INTRAVENOUS

## 2023-11-17 MED ORDER — MORPHINE SULFATE (PF) 4 MG/ML IV SOLN
4.0000 mg | INTRAVENOUS | Status: DC | PRN
Start: 2023-11-17 — End: 2023-11-18
  Administered 2023-11-18: 4 mg via INTRAVENOUS
  Filled 2023-11-17: qty 1

## 2023-11-17 MED ORDER — ACETAMINOPHEN 10 MG/ML IV SOLN: 1000.0000 mg | Freq: Once | INTRAVENOUS | Status: DC | PRN

## 2023-11-17 MED ORDER — OXYCODONE HCL 5 MG PO TABS
5.0000 mg | ORAL_TABLET | ORAL | Status: DC | PRN
  Filled 2023-11-17: qty 1

## 2023-11-17 MED ORDER — ONDANSETRON HCL 4 MG/2ML IJ SOLN
INTRAMUSCULAR | Status: AC
  Filled 2023-11-17: qty 2

## 2023-11-17 MED ORDER — BUPIVACAINE-EPINEPHRINE (PF) 0.25% -1:200000 IJ SOLN
INTRAMUSCULAR | Status: AC
  Filled 2023-11-17: qty 30

## 2023-11-17 MED ORDER — PROPOFOL 10 MG/ML IV BOLUS
INTRAVENOUS | Status: DC | PRN
  Administered 2023-11-17: 200 mg via INTRAVENOUS

## 2023-11-17 MED ORDER — OXYCODONE HCL 5 MG PO TABS: 5.0000 mg | ORAL_TABLET | Freq: Once | ORAL | Status: DC | PRN

## 2023-11-17 MED ORDER — FENTANYL CITRATE (PF) 100 MCG/2ML IJ SOLN
INTRAMUSCULAR | Status: DC | PRN
  Administered 2023-11-17: 50 ug via INTRAVENOUS
  Administered 2023-11-17: 100 ug via INTRAVENOUS

## 2023-11-17 MED ORDER — MIDAZOLAM HCL 2 MG/2ML IJ SOLN
INTRAMUSCULAR | Status: AC
Start: 2023-11-17 — End: ?
  Filled 2023-11-17: qty 2

## 2023-11-17 MED ORDER — KETOROLAC TROMETHAMINE 15 MG/ML IJ SOLN
15.0000 mg | Freq: Once | INTRAMUSCULAR | Status: AC
  Administered 2023-11-17: 15 mg via INTRAVENOUS
  Filled 2023-11-17: qty 1

## 2023-11-17 SURGICAL SUPPLY — 40 items
BAG COUNTER SPONGE SURGICOUNT (BAG) ×1 IMPLANT
BLADE CLIPPER SURG (BLADE) IMPLANT
CHLORAPREP W/TINT 26 (MISCELLANEOUS) ×1 IMPLANT
CNTNR URN SCR LID CUP LEK RST (MISCELLANEOUS) IMPLANT
COVER SURGICAL LIGHT HANDLE (MISCELLANEOUS) ×1 IMPLANT
CUTTER FLEX LINEAR 45M (STAPLE) ×1 IMPLANT
DERMABOND ADVANCED .7 DNX12 (GAUZE/BANDAGES/DRESSINGS) ×1 IMPLANT
ELECT REM PT RETURN 9FT ADLT (ELECTROSURGICAL) ×1 IMPLANT
ELECTRODE REM PT RTRN 9FT ADLT (ELECTROSURGICAL) ×1 IMPLANT
GLOVE BIO SURGEON STRL SZ8 (GLOVE) ×1 IMPLANT
GLOVE BIOGEL PI IND STRL 8 (GLOVE) ×1 IMPLANT
GOWN STRL REUS W/ TWL LRG LVL3 (GOWN DISPOSABLE) ×2 IMPLANT
GOWN STRL REUS W/ TWL XL LVL3 (GOWN DISPOSABLE) ×1 IMPLANT
IRRIG SUCT STRYKERFLOW 2 WTIP (MISCELLANEOUS) ×1 IMPLANT
IRRIGATION SUCT STRKRFLW 2 WTP (MISCELLANEOUS) ×1 IMPLANT
KIT BASIN OR (CUSTOM PROCEDURE TRAY) ×1 IMPLANT
KIT TURNOVER KIT B (KITS) ×1 IMPLANT
MANIFOLD NEPTUNE II (INSTRUMENTS) ×1 IMPLANT
NDL 22X1.5 STRL (OR ONLY) (MISCELLANEOUS) ×1 IMPLANT
NEEDLE 22X1.5 STRL (OR ONLY) (MISCELLANEOUS) ×1 IMPLANT
NS IRRIG 1000ML POUR BTL (IV SOLUTION) ×1 IMPLANT
PAD ARMBOARD 7.5X6 YLW CONV (MISCELLANEOUS) ×1 IMPLANT
POUCH RETRIEVAL ECOSAC 10 (ENDOMECHANICALS) ×1 IMPLANT
RELOAD 45 VASCULAR/THIN (ENDOMECHANICALS) ×1 IMPLANT
RELOAD STAPLE 45 2.5 WHT GRN (ENDOMECHANICALS) IMPLANT
RELOAD STAPLE 45 3.5 BLU ETS (ENDOMECHANICALS) IMPLANT
RELOAD STAPLE TA45 3.5 REG BLU (ENDOMECHANICALS) IMPLANT
SCISSORS LAP 5X35 DISP (ENDOMECHANICALS) IMPLANT
SET TUBE SMOKE EVAC HIGH FLOW (TUBING) ×1 IMPLANT
SHEARS HARMONIC ACE PLUS 36CM (ENDOMECHANICALS) ×1 IMPLANT
SPECIMEN JAR SMALL (MISCELLANEOUS) ×1 IMPLANT
SUT VIC AB 4-0 PS2 27 (SUTURE) ×1 IMPLANT
TOWEL GREEN STERILE (TOWEL DISPOSABLE) ×1 IMPLANT
TOWEL GREEN STERILE FF (TOWEL DISPOSABLE) ×1 IMPLANT
TRAY LAPAROSCOPIC MC (CUSTOM PROCEDURE TRAY) ×1 IMPLANT
TROCAR BALLN 12MMX100 BLUNT (TROCAR) ×1 IMPLANT
TROCAR Z THREAD OPTICAL 12X100 (TROCAR) ×1 IMPLANT
TROCAR Z-THREAD OPTICAL 5X100M (TROCAR) ×1 IMPLANT
WARMER LAPAROSCOPE (MISCELLANEOUS) ×1 IMPLANT
WATER STERILE IRR 1000ML POUR (IV SOLUTION) ×1 IMPLANT

## 2023-11-17 NOTE — ED Notes (Signed)
PT LEFT.

## 2023-11-17 NOTE — H&P (Signed)
Vincent Matthews is an 27 y.o. male.   Chief Complaint: lower abdominal pain HPI: 27yo M with no significant past medical history developed lower abdominal pain yesterday.  It was associated with nausea and vomiting.  The pain persisted and he came to the emergency department today.  Workup included white blood cell count 9900.  CT scan of the abdomen and pelvis reveals acute appendicitis.  I was asked to see him for surgical management.  He continues to have pain.  Past Medical History:  Diagnosis Date   Asthma    Dysphagia     Past Surgical History:  Procedure Laterality Date   ESOPHAGOGASTRODUODENOSCOPY  05/26/2012   Procedure: ESOPHAGOGASTRODUODENOSCOPY (EGD);  Surgeon: Jon Gills, MD;  Location: [facility] OR;  Service: Gastroenterology;  Laterality: N/A;   ESOPHAGOGASTRODUODENOSCOPY N/A 08/11/2017   Procedure: ESOPHAGOGASTRODUODENOSCOPY (EGD);  Surgeon: Kerin Salen, MD;  Location: [facility] ENDOSCOPY;  Service: Gastroenterology;  Laterality: N/A;   nasal cauterization      Family History  Problem Relation Age of Onset   Asthma Father    Social History:  reports that he has been smoking cigarettes and cigars. He has never used smokeless tobacco. He reports that he does not currently use alcohol. He reports that he does not use drugs.  Allergies:  Allergies  Allergen Reactions   Fish Allergy Anaphylaxis   Peanut Butter Flavoring Agent (Non-Screening) Anaphylaxis   Peanut-Containing Drug Products Anaphylaxis    (Not in a hospital admission)   Results for orders placed or performed during the hospital encounter of 11/17/23 (from the past 48 hours)  CBC with Differential     Status: Abnormal   Collection Time: 11/17/23  3:24 PM  Result Value Ref Range   WBC 9.9 4.0 - 10.5 K/uL   RBC 5.55 4.22 - 5.81 MIL/uL   Hemoglobin 15.5 13.0 - 17.0 g/dL   HCT 86.5 78.4 - 69.6 %   MCV 85.0 80.0 - 100.0 fL   MCH 27.9 26.0 - 34.0 pg   MCHC 32.8 30.0 - 36.0 g/dL   RDW 29.5 28.4 - 13.2 %   Platelets  177 150 - 400 K/uL   nRBC 0.0 0.0 - 0.2 %   Neutrophils Relative % 84 %   Neutro Abs 8.3 (H) 1.7 - 7.7 K/uL   Lymphocytes Relative 9 %   Lymphs Abs 0.9 0.7 - 4.0 K/uL   Monocytes Relative 7 %   Monocytes Absolute 0.7 0.1 - 1.0 K/uL   Eosinophils Relative 0 %   Eosinophils Absolute 0.0 0.0 - 0.5 K/uL   Basophils Relative 0 %   Basophils Absolute 0.0 0.0 - 0.1 K/uL   Immature Granulocytes 0 %   Abs Immature Granulocytes 0.03 0.00 - 0.07 K/uL    Comment: Performed at [facility], [address] [place], [place] [address]  Comprehensive metabolic panel     Status: Abnormal   Collection Time: 11/17/23  3:24 PM  Result Value Ref Range   Sodium 141 135 - 145 mmol/L   Potassium 3.8 3.5 - 5.1 mmol/L   Chloride 108 98 - 111 mmol/L   CO2 22 22 - 32 mmol/L   Glucose, Bld 117 (H) 70 - 99 mg/dL    Comment: Glucose reference range applies only to samples taken after fasting for at least 8 hours.   BUN 14 6 - 20 mg/dL   Creatinine, Ser 2.72 0.61 - 1.24 mg/dL   Calcium 9.3 8.9 - 53.6 mg/dL   Total Protein 7.6  6.5 - 8.1 g/dL   Albumin 4.0 3.5 - 5.0 g/dL   AST 21 15 - 41 U/L   ALT 15 0 - 44 U/L   Alkaline Phosphatase 55 38 - 126 U/L   Total Bilirubin 0.7 <1.2 mg/dL   GFR, Estimated >69 >62 mL/min    Comment: (NOTE) Calculated using the CKD-EPI Creatinine Equation (2021)    Anion gap 11 5 - 15    Comment: Performed at [facility], [address], [place], [place] [address]  Lipase, blood     Status: None   Collection Time: 11/17/23  3:24 PM  Result Value Ref Range   Lipase 23 11 - 51 U/L    Comment: Performed at [facility], [address], [place], [place] [address]   No results found.  Review of Systems  Constitutional:  Positive for appetite change.  HENT: Negative.    Eyes: Negative.   Respiratory: Negative.    Cardiovascular: Negative.   Gastrointestinal:  Positive for abdominal pain, nausea and vomiting.  Endocrine: Negative.   Genitourinary: Negative.    Musculoskeletal: Negative.   Allergic/Immunologic: Negative.   Neurological: Negative.   Hematological: Negative.   Psychiatric/Behavioral: Negative.      Blood pressure 128/68, pulse 74, temperature 98.4 F (36.9 C), resp. rate 18, height 5\' 9"  (1.753 m), weight 117.9 kg, SpO2 99%. Physical Exam HENT:     Head: Normocephalic.  Cardiovascular:     Rate and Rhythm: Normal rate and regular rhythm.  Pulmonary:     Effort: Pulmonary effort is normal.     Breath sounds: Normal breath sounds. No wheezing.  Abdominal:     General: Abdomen is flat. There is no distension.     Tenderness: There is abdominal tenderness in the right lower quadrant. There is no guarding or rebound.  Skin:    General: Skin is warm.  Neurological:     Mental Status: He is alert and oriented to person, place, and time.  Psychiatric:        Mood and Affect: Mood normal.      Assessment/Plan Acute appendicitis -IV Rocephin and Flagyl.  Will proceed to the operating room for laparoscopic appendectomy.  I discussed the procedure, risks, and benefits in detail with him.  He is agreeable.  Liz Malady, MD 11/17/2023, 7:14 PM

## 2023-11-17 NOTE — Anesthesia Preprocedure Evaluation (Addendum)
Anesthesia Evaluation  Patient identified by MRN, date of birth, ID band Patient awake    Reviewed: Allergy & Precautions, H&P , NPO status , Patient's Chart, lab work & pertinent test results  Airway Mallampati: II  TM Distance: >3 FB Neck ROM: Full    Dental no notable dental hx.    Pulmonary asthma , Current Smoker   Pulmonary exam normal breath sounds clear to auscultation       Cardiovascular negative cardio ROS Normal cardiovascular exam Rhythm:Regular Rate:Normal     Neuro/Psych negative neurological ROS  negative psych ROS   GI/Hepatic Neg liver ROS,,,Dysphagia  Acute appendicitis   Endo/Other  negative endocrine ROS    Renal/GU negative Renal ROS  negative genitourinary   Musculoskeletal negative musculoskeletal ROS (+)    Abdominal   Peds negative pediatric ROS (+)  Hematology negative hematology ROS (+)   Anesthesia Other Findings   Reproductive/Obstetrics negative OB ROS                             Anesthesia Physical Anesthesia Plan  ASA: 2  Anesthesia Plan: General   Post-op Pain Management:    Induction: Intravenous and Rapid sequence  PONV Risk Score and Plan: 2 and Ondansetron, Dexamethasone and Treatment may vary due to age or medical condition  Airway Management Planned: Oral ETT  Additional Equipment:   Intra-op Plan:   Post-operative Plan: Extubation in OR  Informed Consent: I have reviewed the patients History and Physical, chart, labs and discussed the procedure including the risks, benefits and alternatives for the proposed anesthesia with the patient or authorized representative who has indicated his/her understanding and acceptance.     Dental advisory given  Plan Discussed with: CRNA  Anesthesia Plan Comments:        Anesthesia Quick Evaluation

## 2023-11-17 NOTE — ED Provider Triage Note (Signed)
Emergency Medicine Provider Triage Evaluation Note  AH DOOD , a 27 y.o. male  was evaluated in triage.  Pt complains of bilateral abdominal painx1 day. Started last night. Endorses nausea and vomiting, but no fever or chills. Denies urinary sx, penile discharge. Had BM today. Reports daily marijuana use.  Review of Systems  Positive: N/V, abdominal pain Negative: fevers  Physical Exam  BP 128/68   Pulse 74   Temp 98.4 F (36.9 C)   Resp 18   SpO2 99%  Gen:   Awake, no distress   Resp:  Normal effort  MSK:   Moves extremities without difficulty Other:  Diffuse abd pain  Medical Decision Making  Medically screening exam initiated at 2:50 PM.  Appropriate orders placed.  Salvadore Oxford was informed that the remainder of the evaluation will be completed by another provider, this initial triage assessment does not replace that evaluation, and the importance of remaining in the ED until their evaluation is complete.     Pete Pelt, [place] 11/17/23 1453

## 2023-11-17 NOTE — ED Triage Notes (Signed)
Pt to ED from UC c/o lower abdominal pain that started last night. No vomiting. Denies hx of abdominal surgery, denies urinary and bowel movement difficulty

## 2023-11-17 NOTE — ED Notes (Signed)
Patient is being discharged from the Urgent Care and sent to the Emergency Department via private vehicle . Per Lamptey MD, patient is in need of higher level of care due to abdominal pain. Patient is aware and verbalizes understanding of plan of care.  Vitals:   11/17/23 1407  BP: 113/69  Pulse: 61  Resp: 18  Temp: 98.1 F (36.7 C)  SpO2: 97%

## 2023-11-17 NOTE — Transfer of Care (Signed)
Immediate Anesthesia Transfer of Care Note  Patient: Vincent Matthews  Procedure(s) Performed: APPENDECTOMY LAPAROSCOPIC (Abdomen)  Patient Location: PACU  Anesthesia Type:General  Level of Consciousness: drowsy  Airway & Oxygen Therapy: Patient Spontanous Breathing and Patient connected to nasal cannula oxygen  Post-op Assessment: Report given to RN, Post -op Vital signs reviewed and stable, and Patient moving all extremities  Post vital signs: Reviewed and stable  Last Vitals:  Vitals Value Taken Time  BP 101/52 11/17/23 2153  Temp    Pulse 84 11/17/23 2157  Resp 23 11/17/23 2157  SpO2 100 % 11/17/23 2157  Vitals shown include unfiled device data.  Last Pain:  Vitals:   11/17/23 1838  PainSc: 10-Worst pain ever         Complications: No notable events documented.

## 2023-11-17 NOTE — ED Triage Notes (Signed)
Stomach pain; Emesis onset last night. No one else sick or with similar symptoms. Pain mainly in the lowe abdomin. No dietary or medication changes. No history of stomach problems.

## 2023-11-17 NOTE — ED Provider Notes (Signed)
[place] EMERGENCY DEPARTMENT AT [facility] Provider Note   CSN: [id number] Arrival date & time: 11/17/23  1437     History  Chief Complaint  Patient presents with   Abdominal Pain    Vincent Matthews is a 27 y.o. male, no pertinent past medical history, who presents to the ED secondary to diffuse abdominal pain, going for the last day.  He states it started last night, sharp and stabbing, and cramping in nature.  Denies any fevers or chills, but has had nausea and vomiting.  Last ate yesterday.  Denies any urinary symptoms, or symptoms of an STD.  Had a bowel movement today without any relief.  Does endorse daily marijuana use.  Has never had pain like this before.  No history of abdominal surgeries.  Home Medications Prior to Admission medications   Medication Sig Start Date End Date Taking? Authorizing Provider  albuterol (VENTOLIN HFA) 108 (90 Base) MCG/ACT inhaler Inhale 2 puffs into the lungs every 4 (four) hours as needed for wheezing or shortness of breath. 09/14/22   Zenia Resides, MD  naproxen (NAPROSYN) 500 MG tablet Take 1 tablet (500 mg total) by mouth 2 (two) times daily. 10/29/23   Roxy Horseman, PA-C  predniSONE (STERAPRED UNI-PAK 21 TAB) 10 MG (21) TBPK tablet Take by mouth daily. Take 6 tabs by mouth daily  for 2 days, then 5 tabs for 2 days, then 4 tabs for 2 days, then 3 tabs for 2 days, 2 tabs for 2 days, then 1 tab by mouth daily for 2 days 02/15/23   Trevor Iha, FNP  sucralfate (CARAFATE) 1 g tablet Take 1 tablet (1 g total) by mouth 4 (four) times daily -  with meals and at bedtime. Patient not taking: Reported on 08/29/2018 02/05/17 07/04/19  Antony Madura, PA-C      Allergies    Fish allergy, Peanut butter flavoring agent (non-screening), and Peanut-containing drug products    Review of Systems   Review of Systems  Gastrointestinal:  Positive for abdominal pain, nausea and vomiting. Negative for diarrhea.    Physical Exam Updated Vital  Signs BP 128/68   Pulse 74   Temp 98.4 F (36.9 C)   Resp 18   Ht 5\' 9"  (1.753 m)   Wt 117.9 kg   SpO2 99%   BMI 38.40 kg/m  Physical Exam Vitals and nursing note reviewed.  Constitutional:      General: He is not in acute distress.    Appearance: He is well-developed.  HENT:     Head: Normocephalic and atraumatic.  Eyes:     Conjunctiva/sclera: Conjunctivae normal.  Cardiovascular:     Rate and Rhythm: Normal rate and regular rhythm.     Heart sounds: No murmur heard. Pulmonary:     Effort: Pulmonary effort is normal. No respiratory distress.     Breath sounds: Normal breath sounds.  Abdominal:     Palpations: Abdomen is soft.     Tenderness: There is generalized abdominal tenderness. There is guarding. There is no rebound.     Comments: Guarding in RLQ  Musculoskeletal:        General: No swelling.     Cervical back: Neck supple.  Skin:    General: Skin is warm and dry.     Capillary Refill: Capillary refill takes less than 2 seconds.  Neurological:     Mental Status: He is alert.  Psychiatric:        Mood and Affect: Mood  normal.     ED Results / Procedures / Treatments   Labs (all labs ordered are listed, but only abnormal results are displayed) Labs Reviewed  CBC WITH DIFFERENTIAL/PLATELET - Abnormal; Notable for the following components:      Result Value   Neutro Abs 8.3 (*)    All other components within normal limits  COMPREHENSIVE METABOLIC PANEL - Abnormal; Notable for the following components:   Glucose, Bld 117 (*)    All other components within normal limits  LIPASE, BLOOD  URINALYSIS, ROUTINE W REFLEX MICROSCOPIC    EKG None  Radiology CT ABDOMEN PELVIS W CONTRAST Result Date: 11/17/2023 CLINICAL DATA:  Acute non localized abdominal pain. EXAM: CT ABDOMEN AND PELVIS WITH CONTRAST TECHNIQUE: Multidetector CT imaging of the abdomen and pelvis was performed using the standard protocol following bolus administration of intravenous contrast.  RADIATION DOSE REDUCTION: This exam was performed according to the departmental dose-optimization program which includes automated exposure control, adjustment of the mA and/or kV according to patient size and/or use of iterative reconstruction technique. CONTRAST:  75mL OMNIPAQUE IOHEXOL 350 MG/ML SOLN COMPARISON:  None Available. FINDINGS: Lower Chest: No acute findings. Hepatobiliary: No suspicious hepatic masses identified. Gallbladder is unremarkable. No evidence of biliary ductal dilatation. Pancreas:  No mass or inflammatory changes. Spleen: Within normal limits in size and appearance. Adrenals/Urinary Tract: No suspicious masses identified. No evidence of ureteral calculi or hydronephrosis. Stomach/Bowel: Enlarged appendix measuring 11 mm in diameter with moderate periappendiceal inflammatory changes is seen, consistent with acute appendicitis. No No evidence of rupture or abscess. No No evidence of bowel obstruction. No evidence of obstruction, inflammatory process or abnormal fluid collections. Vascular/Lymphatic: No pathologically enlarged lymph nodes. No acute vascular findings. Reproductive:  No mass or other significant abnormality. Other:  None. Musculoskeletal:  No suspicious bone lesions identified. IMPRESSION: Acute uncomplicated appendicitis. No evidence of rupture or abscess. Electronically Signed   By: Danae Orleans M.D.   On: 11/17/2023 18:05    Procedures Procedures    Medications Ordered in ED Medications  cefTRIAXone (ROCEPHIN) 2 g in sodium chloride 0.9 % 100 mL IVPB (has no administration in time range)  metroNIDAZOLE (FLAGYL) IVPB 500 mg (has no administration in time range)  ketorolac (TORADOL) 15 MG/ML injection 15 mg (has no administration in time range)  ondansetron (ZOFRAN) injection 4 mg (has no administration in time range)  iohexol (OMNIPAQUE) 350 MG/ML injection 75 mL (75 mLs Intravenous Contrast Given 11/17/23 1718)    ED Course/ Medical Decision Making/ A&P                                  Medical Decision Making Patient is a 27 year old male, here for generalized vomiting this been on for about a day.  Came on acutely, states this sharp and stabbing, with some nausea and vomiting.  No diarrhea.  No fevers or chills.  History of cannabis use.  Will obtain a CT abdomen pelvis, given guarding the right lower quadrant as well as tenderness to palpation of this area  Toradol for pain  Amount and/or Complexity of Data Reviewed Labs: ordered.    Details: No leukocytosis Radiology: ordered.    Details: CT abdomen pelvis concerning for acute uncomplicated appendicitis Discussion of management or test interpretation with external provider(s): Discussed with Dr. Violeta Gelinas, patient has acute uncomplicated appendicitis.  Given ceftriaxone and Flagyl, as well as Toradol for pain control.  Dr. Janee Morn, general surgery,  agrees to admit. Afebrile, overall well appearing at this time.  Risk Prescription drug management. Decision regarding hospitalization.   Final Clinical Impression(s) / ED Diagnoses Final diagnoses:  Acute appendicitis with localized peritonitis, without perforation, abscess, or gangrene    Rx / DC Orders ED Discharge Orders     None         Pete Pelt, PA 11/17/23 2012    Arby Barrette, MD 11/25/23 1345

## 2023-11-17 NOTE — Discharge Instructions (Signed)
Please go to the emergency room for further evaluation.  You will need further imaging of your abdomen.

## 2023-11-17 NOTE — Op Note (Signed)
  11/17/2023  9:37 PM  PATIENT:  Vincent Matthews  27 y.o. male  PRE-OPERATIVE DIAGNOSIS:  Acute Appendicitis  POST-OPERATIVE DIAGNOSIS:  Acute Appendicitis  PROCEDURE:  Procedure(s): APPENDECTOMY LAPAROSCOPIC  SURGEON:  Surgeon(s): Violeta Gelinas, MD  ASSISTANTS: none   ANESTHESIA:   local and general  EBL:  No intake/output data recorded.  BLOOD ADMINISTERED:none  DRAINS: none   SPECIMEN:  Excision  DISPOSITION OF SPECIMEN:  PATHOLOGY  COUNTS:  YES  DICTATION: .Dragon Dictation Procedure detail: Informed consent was obtained.  He received intravenous antibiotics.  He was brought to the operating room and general endotracheal anesthesia was administered by the anesthesia staff.  His abdomen was prepped and draped in a sterile fashion.  Timeout procedure was performed.The supraumbilical region was infiltrated with local. Supraumbilical incision was made. Subcutaneous tissues were dissected down revealing the anterior fascia. This was divided sharply along the midline. Peritoneal cavity was entered under direct vision without complication. A 0 Vicryl pursestring was placed around the fascial opening. Hassan trocar was inserted into the abdomen. The abdomen was insufflated with carbon dioxide in standard fashion. Under direct vision a 12 mm left lower quadrant and a 5 mm right mid abdominal port were placed.  Local was used at each port site.  Laparoscopic exploration revealed an inflamed appendix adherent to the right abdominal sidewall.  It was gently freed up from the abdominal sidewall and straightened out.  It was not perforated but it was very inflamed.  The mesoappendix was divided with the harmonic scalpel getting excellent hemostasis.  Once the base was skeletonized, it was divided with Endo GIA with a vascular load.  The appendix was placed in a bag and removed from the abdomen.  It was sent to pathology.  The area was copiously irrigated.  The staple line on the cecum was  intact.  There was no bleeding.  The remainder the irrigation fluid was evacuated.  Ports were removed under direct vision.  Pneumoperitoneum was released.  Supraumbilical fascia was closed by tying the pursestring.  The skin of each incision was irrigated and the skin was closed with 4-0 Vicryl followed by Dermabond.  All counts were correct.  He tolerated the procedure well without apparent complication and was taken recovery in stable condition.  PATIENT DISPOSITION:  PACU - hemodynamically stable.   Delay start of Pharmacological VTE agent (>24hrs) due to surgical blood loss or risk of bleeding:  no  Violeta Gelinas, MD, MPH, FACS Pager: [phone]  12/12/20249:37 PM

## 2023-11-18 ENCOUNTER — Encounter (HOSPITAL_COMMUNITY): Payer: Self-pay | Admitting: General Surgery

## 2023-11-18 MED ORDER — OXYCODONE HCL 5 MG PO TABS
5.0000 mg | ORAL_TABLET | ORAL | Status: DC | PRN
  Administered 2023-11-18 (×2): 5 mg via ORAL
  Filled 2023-11-18: qty 1
  Filled 2023-11-18: qty 2

## 2023-11-18 MED ORDER — DOCUSATE SODIUM 100 MG PO CAPS: 100.0000 mg | ORAL_CAPSULE | Freq: Two times a day (BID) | ORAL | 0 refills | Status: AC

## 2023-11-18 MED ORDER — ACETAMINOPHEN 500 MG PO TABS: 1000.0000 mg | ORAL_TABLET | Freq: Four times a day (QID) | ORAL | Status: AC

## 2023-11-18 MED ORDER — LIDOCAINE 5 % EX PTCH: 1.0000 | MEDICATED_PATCH | CUTANEOUS | 0 refills | Status: AC

## 2023-11-18 MED ORDER — IBUPROFEN 200 MG PO TABS: 600.0000 mg | ORAL_TABLET | Freq: Four times a day (QID) | ORAL | Status: AC | PRN

## 2023-11-18 MED ORDER — LIDOCAINE 5 % EX PTCH: 1.0000 | MEDICATED_PATCH | CUTANEOUS | Status: DC

## 2023-11-18 MED ORDER — DOCUSATE SODIUM 100 MG PO CAPS
100.0000 mg | ORAL_CAPSULE | Freq: Two times a day (BID) | ORAL | Status: DC
  Administered 2023-11-18: 100 mg via ORAL
  Filled 2023-11-18: qty 1

## 2023-11-18 MED ORDER — OXYCODONE HCL 5 MG PO TABS: 5.0000 mg | ORAL_TABLET | ORAL | 0 refills | Status: AC | PRN

## 2023-11-18 MED ORDER — METHOCARBAMOL 500 MG PO TABS: 500.0000 mg | ORAL_TABLET | Freq: Three times a day (TID) | ORAL | 0 refills | Status: AC | PRN

## 2023-11-18 MED ORDER — MORPHINE SULFATE (PF) 4 MG/ML IV SOLN: 4.0000 mg | INTRAVENOUS | Status: DC | PRN

## 2023-11-18 MED ORDER — METHOCARBAMOL 500 MG PO TABS: 500.0000 mg | ORAL_TABLET | Freq: Four times a day (QID) | ORAL | Status: DC | PRN

## 2023-11-18 MED ORDER — IBUPROFEN 200 MG PO TABS
600.0000 mg | ORAL_TABLET | Freq: Four times a day (QID) | ORAL | Status: DC
  Administered 2023-11-18: 600 mg via ORAL
  Filled 2023-11-18: qty 3

## 2023-11-18 MED ORDER — POLYETHYLENE GLYCOL 3350 17 G PO PACK: 17.0000 g | PACK | Freq: Every day | ORAL | Status: DC | PRN

## 2023-11-18 NOTE — Anesthesia Postprocedure Evaluation (Signed)
Anesthesia Post Note  Patient: Vincent Matthews  Procedure(s) Performed: APPENDECTOMY LAPAROSCOPIC (Abdomen)     Patient location during evaluation: PACU Anesthesia Type: General Level of consciousness: awake and alert Pain management: pain level controlled Vital Signs Assessment: post-procedure vital signs reviewed and stable Respiratory status: spontaneous breathing, nonlabored ventilation, respiratory function stable and patient connected to nasal cannula oxygen Cardiovascular status: blood pressure returned to baseline and stable Postop Assessment: no apparent nausea or vomiting Anesthetic complications: no   No notable events documented.  Last Vitals:  Vitals:   11/17/23 2259 11/18/23 0451  BP: 105/61 (!) 104/57  Pulse: 77 63  Resp:    Temp: 36.7 C 36.8 C  SpO2: 97% 99%    Last Pain:  Vitals:   11/17/23 2259  TempSrc: Oral  PainSc: 0-No pain                  [place]

## 2023-11-18 NOTE — Progress Notes (Signed)
Patient ID: Vincent Matthews, male   DOB: [birth year], 27 y.o.   MRN: [id number] [facility] Surgery Progress Note  1 Day Post-Op  Subjective: CC-  Mother at bedside. Having a lot of lower abdominal pain. Denies n/v. Tolerating sips of liquids. He did ambulate to the restroom and void without issues. Received IV pain medication earlier this morning which helped some, but pain has returned.  Objective: Vital signs in last 24 hours: Temp:  [98 F (36.7 C)-98.6 F (37 C)] 98.6 F (37 C) (12/13 0741) Pulse Rate:  [61-84] 73 (12/13 0741) Resp:  [13-25] 18 (12/13 0741) BP: (95-128)/(50-69) 106/54 (12/13 0741) SpO2:  [95 %-100 %] 99 % (12/13 0741) Weight:  [105.5 kg-117.9 kg] 105.5 kg (12/12 2259)    Intake/Output from previous day: 12/12 0701 - 12/13 0700 In: 1671.1 [P.O.:360; I.V.:1311.1] Out: -  Intake/Output this shift: No intake/output data recorded.  PE: Gen:  Alert, NAD, pleasant Pulm:  rate and effort normal on room air Abd: soft, ND, appropriately tender in the lower abdomen R>L, incisions cdi with dermabond present  Lab Results:  Recent Labs    11/17/23 1524  WBC 9.9  HGB 15.5  HCT 47.2  PLT 177   BMET Recent Labs    11/17/23 1524  NA 141  K 3.8  CL 108  CO2 22  GLUCOSE 117*  BUN 14  CREATININE 0.93  CALCIUM 9.3   PT/INR No results for input(s): "LABPROT", "INR" in the last 72 hours. CMP     Component Value Date/Time   NA 141 11/17/2023 1524   K 3.8 11/17/2023 1524   CL 108 11/17/2023 1524   CO2 22 11/17/2023 1524   GLUCOSE 117 (H) 11/17/2023 1524   BUN 14 11/17/2023 1524   CREATININE 0.93 11/17/2023 1524   CALCIUM 9.3 11/17/2023 1524   PROT 7.6 11/17/2023 1524   ALBUMIN 4.0 11/17/2023 1524   AST 21 11/17/2023 1524   ALT 15 11/17/2023 1524   ALKPHOS 55 11/17/2023 1524   BILITOT 0.7 11/17/2023 1524   GFRNONAA >60 11/17/2023 1524   GFRAA >60 07/04/2019 1204   Lipase     Component Value Date/Time   LIPASE 23 11/17/2023 1524        Studies/Results: CT ABDOMEN PELVIS W CONTRAST Result Date: 11/17/2023 CLINICAL DATA:  Acute non localized abdominal pain. EXAM: CT ABDOMEN AND PELVIS WITH CONTRAST TECHNIQUE: Multidetector CT imaging of the abdomen and pelvis was performed using the standard protocol following bolus administration of intravenous contrast. RADIATION DOSE REDUCTION: This exam was performed according to the departmental dose-optimization program which includes automated exposure control, adjustment of the mA and/or kV according to patient size and/or use of iterative reconstruction technique. CONTRAST:  75mL OMNIPAQUE IOHEXOL 350 MG/ML SOLN COMPARISON:  None Available. FINDINGS: Lower Chest: No acute findings. Hepatobiliary: No suspicious hepatic masses identified. Gallbladder is unremarkable. No evidence of biliary ductal dilatation. Pancreas:  No mass or inflammatory changes. Spleen: Within normal limits in size and appearance. Adrenals/Urinary Tract: No suspicious masses identified. No evidence of ureteral calculi or hydronephrosis. Stomach/Bowel: Enlarged appendix measuring 11 mm in diameter with moderate periappendiceal inflammatory changes is seen, consistent with acute appendicitis. No No evidence of rupture or abscess. No No evidence of bowel obstruction. No evidence of obstruction, inflammatory process or abnormal fluid collections. Vascular/Lymphatic: No pathologically enlarged lymph nodes. No acute vascular findings. Reproductive:  No mass or other significant abnormality. Other:  None. Musculoskeletal:  No suspicious bone lesions identified. IMPRESSION: Acute uncomplicated appendicitis. No evidence  of rupture or abscess. Electronically Signed   By: Danae Orleans M.D.   On: 11/17/2023 18:05    Anti-infectives: Anti-infectives (From admission, onward)    Start     Dose/Rate Route Frequency Ordered Stop   11/17/23 1900  piperacillin-tazobactam (ZOSYN) IVPB 3.375 g  Status:  Discontinued        3.375  g 100 mL/hr over 30 Minutes Intravenous  Once 11/17/23 1851 11/17/23 1852   11/17/23 1900  cefTRIAXone (ROCEPHIN) 2 g in sodium chloride 0.9 % 100 mL IVPB        2 g 200 mL/hr over 30 Minutes Intravenous  Once 11/17/23 1852 11/17/23 2045   11/17/23 1900  metroNIDAZOLE (FLAGYL) IVPB 500 mg        500 mg 100 mL/hr over 60 Minutes Intravenous  Once 11/17/23 1852 11/17/23 2116        Assessment/Plan  POD#1 s/p laparoscopic appendectomy 12/12 Dr. Janee Morn - Regular diet. Schedule tylenol and ibuprofen. Transition to oxycodone from dilaudid. Mobilize. Plan for discharge later today if pain is better controlled on oral regimen.  ID - rocephin/flagyl and zosyn periop, no need for more abx FEN - SLIV, reg diet VTE - lovenox Foley - none   LOS: 1 day    Franne Forts, [facility] 11/18/2023, 8:23 AM Please see Amion for pager number during day hours 7:00am-4:30pm

## 2023-11-18 NOTE — Discharge Instructions (Signed)

## 2023-11-18 NOTE — Care Management (Signed)
  Transition of Care [facility]) Screening Note   Patient Details  Name: Vincent Matthews Date of Birth: 13-Sep-1996   Transition of Care [facility]) CM/SW Contact:    Lockie Pares, RN Phone Number: 11/18/2023, 10:32 AM    Transition of Care Department [facility]) has reviewed patient and no TOC needs have been identified at this time. We will continue to monitor patient advancement through interdisciplinary progression rounds. If new patient transition needs arise, please place a TOC consult.

## 2023-11-19 NOTE — ED Provider Notes (Signed)
[facility]    CSN: [id number] Arrival date & time: 11/17/23  1342      History   Chief Complaint Chief Complaint  Patient presents with   Abdominal Pain   Emesis    HPI Vincent Matthews is a 27 y.o. male comes to the urgent care with 1 day history of lower abdominal pain.  Pain is severe, constant with no known aggravating or relieving. Pain is in the right lower abdomen.  It does not send her with some vomiting.  No fever or chills.  No trauma to the abdomen.  No diarrhea.  HPI  Past Medical History:  Diagnosis Date   Asthma    Dysphagia     Patient Active Problem List   Diagnosis Date Noted   Appendicitis 11/17/2023   S/P laparoscopic appendectomy 11/17/2023   Swallowing difficulty     Past Surgical History:  Procedure Laterality Date   ESOPHAGOGASTRODUODENOSCOPY  05/26/2012   Procedure: ESOPHAGOGASTRODUODENOSCOPY (EGD);  Surgeon: Jon Gills, MD;  Location: [facility] OR;  Service: Gastroenterology;  Laterality: N/A;   ESOPHAGOGASTRODUODENOSCOPY N/A 08/11/2017   Procedure: ESOPHAGOGASTRODUODENOSCOPY (EGD);  Surgeon: Kerin Salen, MD;  Location: [facility]COPY;  Service: Gastroenterology;  Laterality: N/A;   LAPAROSCOPIC APPENDECTOMY N/A 11/17/2023   Procedure: APPENDECTOMY LAPAROSCOPIC;  Surgeon: Violeta Gelinas, MD;  Location: [facility] OR;  Service: General;  Laterality: N/A;   nasal cauterization         Home Medications    Prior to Admission medications   Medication Sig Start Date End Date Taking? Authorizing Provider  acetaminophen (TYLENOL) 500 MG tablet Take 2 tablets (1,000 mg total) by mouth every 6 (six) hours. 11/18/23   Adam Phenix, PA-C  albuterol (VENTOLIN HFA) 108 (90 Base) MCG/ACT inhaler Inhale 2 puffs into the lungs every 4 (four) hours as needed for wheezing or shortness of breath. Patient not taking: Reported on 11/18/2023 09/14/22   Zenia Resides, MD  docusate sodium (COLACE) 100 MG capsule Take 1 capsule (100 mg total) by mouth 2  (two) times daily. 11/18/23   Adam Phenix, PA-C  ibuprofen (ADVIL) 200 MG tablet Take 3 tablets (600 mg total) by mouth every 6 (six) hours as needed for up to 7 days. 11/18/23 11/25/23  Adam Phenix, PA-C  lidocaine (LIDODERM) 5 % Place 1 patch onto the skin daily for 7 days. Remove & Discard patch within 12 hours or as directed by MD 11/18/23 11/25/23  Adam Phenix, PA-C  methocarbamol (ROBAXIN) 500 MG tablet Take 1 tablet (500 mg total) by mouth every 8 (eight) hours as needed for muscle spasms. 11/18/23   Adam Phenix, PA-C  oxyCODONE (OXY IR/ROXICODONE) 5 MG immediate release tablet Take 1-2 tablets (5-10 mg total) by mouth every 4 (four) hours as needed for moderate pain (pain score 4-6) or severe pain (pain score 7-10) (5mg  moderate, 10mg  severe; try tylenol and advil first). 11/18/23   Adam Phenix, PA-C  sucralfate (CARAFATE) 1 g tablet Take 1 tablet (1 g total) by mouth 4 (four) times daily -  with meals and at bedtime. Patient not taking: Reported on 08/29/2018 02/05/17 07/04/19  Antony Madura, PA-C    Family History Family History  Problem Relation Age of Onset   Asthma Father     Social History Social History   Tobacco Use   Smoking status: Some Days    Types: Cigarettes, Cigars   Smokeless tobacco: Never  Vaping Use   Vaping status: Some Days  Substance  Use Topics   Alcohol use: Not Currently    Comment: rare   Drug use: No     Allergies   Fish allergy, Peanut butter flavoring agent (non-screening), and Peanut-containing drug products   Review of Systems Review of Systems As per HPI  Physical Exam Triage Vital Signs ED Triage Vitals  Encounter Vitals Group     BP 11/17/23 1407 113/69     Systolic BP Percentile --      Diastolic BP Percentile --      Pulse Rate 11/17/23 1407 61     Resp 11/17/23 1407 18     Temp 11/17/23 1407 98.1 F (36.7 C)     Temp Source 11/17/23 1407 Oral     SpO2 11/17/23 1407 97 %     Weight  11/17/23 1407 260 lb (117.9 kg)     Height 11/17/23 1407 5\' 9"  (1.753 m)     Head Circumference --      Peak Flow --      Pain Score 11/17/23 1406 10     Pain Loc --      Pain Education --      Exclude from Growth Chart --    No data found.  Updated Vital Signs BP 113/69 (BP Location: Right Arm)   Pulse 61   Temp 98.1 F (36.7 C) (Oral)   Resp 18   Ht 5\' 9"  (1.753 m)   Wt 117.9 kg   SpO2 97%   BMI 38.40 kg/m   Visual Acuity Right Eye Distance:   Left Eye Distance:   Bilateral Distance:    Right Eye Near:   Left Eye Near:    Bilateral Near:     Physical Exam Vitals and nursing note reviewed.  Constitutional:      General: He is in acute distress.     Appearance: He is ill-appearing.  Pulmonary:     Effort: Pulmonary effort is normal.     Breath sounds: Normal breath sounds.  Abdominal:     Palpations: Abdomen is soft.     Tenderness: There is abdominal tenderness in the right lower quadrant. There is guarding. There is no rebound. Positive signs include McBurney's sign.     Hernia: No hernia is present.  Neurological:     Mental Status: He is alert.      UC Treatments / Results  Labs (all labs ordered are listed, but only abnormal results are displayed) Labs Reviewed - No data to display  EKG   Radiology   Procedures Procedures (including critical care time)  Medications Ordered in UC Medications  ondansetron (ZOFRAN-ODT) disintegrating tablet 4 mg (4 mg Oral Given 11/17/23 1411)    Initial Impression / Assessment and Plan / UC Course  I have reviewed the triage vital signs and the nursing notes.  Pertinent labs & imaging results that were available during my care of the patient were reviewed by me and considered in my medical decision making (see chart for details).     1.  1.  Right lower quadrant abdominal pain: Patient is advised to go to the emergency department.  There is concern for appendicitis.  Patient will need advanced  imaging. Final Clinical Impressions(s) / UC Diagnoses   Final diagnoses:  RLQ abdominal pain     Discharge Instructions      Please go to the emergency room for further evaluation.  You will need further imaging of your abdomen.    ED Prescriptions   None  PDMP not reviewed this encounter.   Merrilee Jansky, MD 11/19/23 [phone]

## 2023-11-21 LAB — SURGICAL PATHOLOGY

## 2023-11-23 NOTE — Discharge Summary (Signed)
[facility] Discharge Summary   Patient ID: Vincent Matthews MRN: [id number] DOB/AGE: 31-Dec-1995 27 y.o.  Admit date: 11/17/2023 Discharge date: 11/18/2023  Admitting Diagnosis: Appendicitis   Discharge Diagnosis Patient Active Problem List   Diagnosis Date Noted   Appendicitis 11/17/2023   S/P laparoscopic appendectomy 11/17/2023   Swallowing difficulty     Consultants None  Imaging: No results found.  Procedures  Dr. Violeta Gelinas (11/17/23) - Laparoscopic Appendectomy  Hospital Course:  27 y/o M who presented to the ED with abdominal pain.  Workup showed appendicitis.  Patient was admitted and underwent procedure listed above.  Tolerated procedure well and was transferred to the floor.  Diet was advanced as tolerated.  On POD#1, the patient was voiding well, tolerating diet, ambulating well, pain well controlled, vital signs stable, incisions c/d/i and felt stable for discharge home.  Patient will follow up in our office in 3-4 weeks and knows to call with questions or concerns.   I have personally reviewed the patients medication history on the [place] controlled substance database.    Allergies as of 11/18/2023       Reactions   Fish Allergy Anaphylaxis   Peanut Butter Flavoring Agent (non-screening) Anaphylaxis   Peanut-containing Drug Products Anaphylaxis        Medication List     STOP taking these medications    naproxen 500 MG tablet Commonly known as: NAPROSYN       TAKE these medications    acetaminophen 500 MG tablet Commonly known as: TYLENOL Take 2 tablets (1,000 mg total) by mouth every 6 (six) hours.   albuterol 108 (90 Base) MCG/ACT inhaler Commonly known as: VENTOLIN HFA Inhale 2 puffs into the lungs every 4 (four) hours as needed for wheezing or shortness of breath.   docusate sodium 100 MG capsule Commonly known as: COLACE Take 1 capsule (100 mg total) by mouth 2 (two) times daily.   ibuprofen 200 MG tablet Commonly  known as: ADVIL Take 3 tablets (600 mg total) by mouth every 6 (six) hours as needed for up to 7 days.   lidocaine 5 % Commonly known as: LIDODERM Place 1 patch onto the skin daily for 7 days. Remove & Discard patch within 12 hours or as directed by MD   methocarbamol 500 MG tablet Commonly known as: ROBAXIN Take 1 tablet (500 mg total) by mouth every 8 (eight) hours as needed for muscle spasms.   oxyCODONE 5 MG immediate release tablet Commonly known as: Oxy IR/ROXICODONE Take 1-2 tablets (5-10 mg total) by mouth every 4 (four) hours as needed for moderate pain (pain score 4-6) or severe pain (pain score 7-10) (5mg  moderate, 10mg  severe; try tylenol and advil first).          Follow-up Information     Violeta Gelinas, MD. Go on 12/14/2023.   Specialty: General Surgery Why: Your appointment is 12/14/23 at 10:10 am Arrive early to check in, fill out paperwork, Bring photo ID and insurance information Contact information: [address] [place] [address] [phone]                 Signed: Hosie Spangle, [facility] 11/23/2023, 10:26 AM
